# Patient Record
Sex: Male | Born: 1945 | Race: White | Hispanic: No | Marital: Married | Smoking: Never smoker
Health system: Southern US, Community
[De-identification: ages and names within clinical notes are randomized; demographics above are authoritative.]

## PROBLEM LIST (undated history)

## (undated) DIAGNOSIS — I4430 Unspecified atrioventricular block: Secondary | ICD-10-CM

## (undated) DIAGNOSIS — I455 Other specified heart block: Secondary | ICD-10-CM

## (undated) DIAGNOSIS — Z950 Presence of cardiac pacemaker: Secondary | ICD-10-CM

## (undated) DIAGNOSIS — E039 Hypothyroidism, unspecified: Secondary | ICD-10-CM

## (undated) DIAGNOSIS — I10 Essential (primary) hypertension: Secondary | ICD-10-CM

## (undated) DIAGNOSIS — G459 Transient cerebral ischemic attack, unspecified: Secondary | ICD-10-CM

## (undated) DIAGNOSIS — M1990 Unspecified osteoarthritis, unspecified site: Secondary | ICD-10-CM

## (undated) DIAGNOSIS — C801 Malignant (primary) neoplasm, unspecified: Secondary | ICD-10-CM

## (undated) DIAGNOSIS — G4730 Sleep apnea, unspecified: Secondary | ICD-10-CM

## (undated) DIAGNOSIS — I499 Cardiac arrhythmia, unspecified: Secondary | ICD-10-CM

## (undated) HISTORY — PX: KNEE ARTHROSCOPY: SUR90

## (undated) HISTORY — PX: INSERT / REPLACE / REMOVE PACEMAKER: SUR710

## (undated) HISTORY — PX: BACK SURGERY: SHX140

## (undated) HISTORY — PX: HERNIA REPAIR: SHX51

## (undated) HISTORY — PX: DUODENAL DIVERTICULECTOMY: SHX6386

## (undated) HISTORY — PX: SHOULDER SURGERY: SHX246

---

## 2011-03-03 ENCOUNTER — Other Ambulatory Visit (HOSPITAL_COMMUNITY): Payer: Self-pay | Admitting: Neurosurgery

## 2011-03-03 DIAGNOSIS — M545 Low back pain: Secondary | ICD-10-CM

## 2011-03-04 ENCOUNTER — Ambulatory Visit (HOSPITAL_COMMUNITY)
Admission: RE | Admit: 2011-03-04 | Discharge: 2011-03-04 | Disposition: A | Discharge status: Home / Self Care | Payer: BC Managed Care – PPO | Source: Ambulatory Visit | Attending: Neurosurgery | Admitting: Neurosurgery

## 2011-03-04 DIAGNOSIS — M6281 Muscle weakness (generalized): Secondary | ICD-10-CM | POA: Insufficient documentation

## 2011-03-04 DIAGNOSIS — M48061 Spinal stenosis, lumbar region without neurogenic claudication: Secondary | ICD-10-CM | POA: Insufficient documentation

## 2011-03-04 DIAGNOSIS — M519 Unspecified thoracic, thoracolumbar and lumbosacral intervertebral disc disorder: Secondary | ICD-10-CM | POA: Insufficient documentation

## 2011-03-04 DIAGNOSIS — M545 Low back pain, unspecified: Secondary | ICD-10-CM | POA: Insufficient documentation

## 2011-03-04 DIAGNOSIS — Q762 Congenital spondylolisthesis: Secondary | ICD-10-CM | POA: Insufficient documentation

## 2011-03-04 DIAGNOSIS — R209 Unspecified disturbances of skin sensation: Secondary | ICD-10-CM | POA: Insufficient documentation

## 2011-03-04 MED ORDER — IOHEXOL 180 MG/ML  SOLN
20.0000 mL | Freq: Once | INTRAMUSCULAR | Status: AC | PRN
  Administered 2011-03-04: 20 mL via INTRATHECAL

## 2011-04-26 ENCOUNTER — Encounter (HOSPITAL_COMMUNITY)
Admission: RE | Admit: 2011-04-26 | Discharge: 2011-04-26 | Disposition: A | Discharge status: Home / Self Care | Payer: BC Managed Care – PPO | Source: Ambulatory Visit | Attending: Neurosurgery | Admitting: Neurosurgery

## 2011-04-26 ENCOUNTER — Other Ambulatory Visit (HOSPITAL_COMMUNITY): Payer: Self-pay | Admitting: Neurosurgery

## 2011-04-26 ENCOUNTER — Ambulatory Visit (HOSPITAL_COMMUNITY)
Admission: RE | Admit: 2011-04-26 | Discharge: 2011-04-26 | Disposition: A | Discharge status: Home / Self Care | Payer: BC Managed Care – PPO | Source: Ambulatory Visit | Attending: Neurosurgery | Admitting: Neurosurgery

## 2011-04-26 DIAGNOSIS — Z01818 Encounter for other preprocedural examination: Secondary | ICD-10-CM | POA: Insufficient documentation

## 2011-04-26 DIAGNOSIS — Z01812 Encounter for preprocedural laboratory examination: Secondary | ICD-10-CM | POA: Insufficient documentation

## 2011-04-26 DIAGNOSIS — Q762 Congenital spondylolisthesis: Secondary | ICD-10-CM | POA: Insufficient documentation

## 2011-04-26 DIAGNOSIS — Z01810 Encounter for preprocedural cardiovascular examination: Secondary | ICD-10-CM | POA: Insufficient documentation

## 2011-04-26 DIAGNOSIS — M4316 Spondylolisthesis, lumbar region: Secondary | ICD-10-CM

## 2011-04-26 LAB — BASIC METABOLIC PANEL
BUN: 18 mg/dL (ref 6–23)
CO2: 31 mEq/L (ref 19–32)
Calcium: 9.9 mg/dL (ref 8.4–10.5)
Chloride: 103 mEq/L (ref 96–112)
Creatinine, Ser: 1.2 mg/dL (ref 0.4–1.5)
GFR calc Af Amer: >60 mL/min (ref >60)

## 2011-04-26 LAB — SURGICAL PCR SCREEN: MRSA, PCR: NEGATIVE

## 2011-04-26 LAB — CBC
MCH: 31.8 pg (ref 26.0–34.0)
MCHC: 34.7 g/dL (ref 30.0–36.0)
MCV: 91.7 fL (ref 78.0–100.0)
Platelets: 215 10*3/uL (ref 150–400)

## 2011-04-29 ENCOUNTER — Inpatient Hospital Stay (HOSPITAL_COMMUNITY)
Admission: RE | Admit: 2011-04-29 | Discharge: 2011-05-02 | DRG: 756 | Disposition: A | Discharge status: Home / Self Care | Payer: BC Managed Care – PPO | Source: Ambulatory Visit | Attending: Neurosurgery | Admitting: Neurosurgery

## 2011-04-29 ENCOUNTER — Inpatient Hospital Stay (HOSPITAL_COMMUNITY): Payer: BC Managed Care – PPO

## 2011-04-29 ENCOUNTER — Other Ambulatory Visit: Payer: Self-pay | Admitting: Neurosurgery

## 2011-04-29 DIAGNOSIS — M51379 Other intervertebral disc degeneration, lumbosacral region without mention of lumbar back pain or lower extremity pain: Secondary | ICD-10-CM | POA: Diagnosis present

## 2011-04-29 DIAGNOSIS — Z01812 Encounter for preprocedural laboratory examination: Secondary | ICD-10-CM

## 2011-04-29 DIAGNOSIS — I10 Essential (primary) hypertension: Secondary | ICD-10-CM | POA: Diagnosis present

## 2011-04-29 DIAGNOSIS — M5137 Other intervertebral disc degeneration, lumbosacral region: Secondary | ICD-10-CM | POA: Diagnosis present

## 2011-04-29 DIAGNOSIS — K5900 Constipation, unspecified: Secondary | ICD-10-CM | POA: Diagnosis not present

## 2011-04-29 DIAGNOSIS — M47817 Spondylosis without myelopathy or radiculopathy, lumbosacral region: Principal | ICD-10-CM | POA: Diagnosis present

## 2011-04-29 DIAGNOSIS — Z472 Encounter for removal of internal fixation device: Secondary | ICD-10-CM

## 2011-04-29 DIAGNOSIS — E669 Obesity, unspecified: Secondary | ICD-10-CM | POA: Diagnosis present

## 2011-04-29 DIAGNOSIS — Q762 Congenital spondylolisthesis: Secondary | ICD-10-CM

## 2011-04-29 DIAGNOSIS — E039 Hypothyroidism, unspecified: Secondary | ICD-10-CM | POA: Diagnosis present

## 2011-04-29 LAB — TYPE AND SCREEN

## 2011-04-29 LAB — ABO/RH: ABO/RH(D): O POS

## 2011-04-29 LAB — GRAM STAIN

## 2011-05-01 LAB — URINALYSIS, ROUTINE W REFLEX MICROSCOPIC
Glucose, UA: NEGATIVE mg/dL
Ketones, ur: NEGATIVE mg/dL
Leukocytes, UA: NEGATIVE
Nitrite: NEGATIVE
Protein, ur: NEGATIVE mg/dL
Urobilinogen, UA: 0.2 mg/dL (ref 0.0–1.0)

## 2011-05-01 LAB — WOUND CULTURE

## 2011-05-01 LAB — URINE MICROSCOPIC-ADD ON

## 2011-05-02 LAB — URINE CULTURE
Colony Count: NO GROWTH
Culture: NO GROWTH

## 2011-05-02 LAB — TISSUE CULTURE: Culture: NO GROWTH

## 2011-05-04 LAB — ANAEROBIC CULTURE

## 2011-05-04 NOTE — Op Note (Signed)
NAME:  MONT, JAGODA                 ACCOUNT NO.:  [id number]  MEDICAL RECORD NO.:  [id number]           PATIENT TYPE:  I  LOCATION:  3007                         FACILITY:  [facility]  PHYSICIAN:  Danae Orleans. Venetia Maxon, M.D.  DATE OF BIRTH:  Jul 31, 1946  DATE OF PROCEDURE:  04/29/2011 DATE OF DISCHARGE:                              OPERATIVE REPORT   PREOPERATIVE DIAGNOSES:  L3-4 and L4-5 spondylosis, stenosis, spondylolisthesis, degenerative disk disease, and radiculopathy.  POSTOPERATIVE DIAGNOSES:  L3-4 and L4-5 spondylosis, stenosis, spondylolisthesis, degenerative disk disease, and radiculopathy.  PROCEDURES: 1. Removal of X-Stop devices placed by another surgeon at L3-4 and L4-     5 levels. 2. Decompressive laminectomy at L3 through L5 levels. 3. Pedicle screw fixation at L3 through L5 bilaterally. 4. Posterolateral arthrodesis L3 through L5 levels.  SURGEON:  Danae Orleans. Venetia Maxon, MD  ASSISTANT:  Georgiann Cocker, RN and Clydene Fake, MD  ANESTHESIA:  General endotracheal anesthesia.  ESTIMATED BLOOD LOSS:  700 mL with Cell Saver blood returned to the patient.  COMPLICATIONS:  None.  DISPOSITION:  Recovery.  INDICATIONS:  Grigor Lipschutz is a 65 year old man with severe lumbar spinal stenosis with a complete myelographic block at L4-5 and an incomplete block at L3-4 with spondylolisthesis at L4-5 greater than L3-4.  He has had prior X-Stop devices placed.  He had some erosion around the L4-5 device that had been placed 5 years ago by another physician.  It was elected because of severity of the patient's pain and inability to stand for greater than couple of minutes for him to undergo decompression and fusion at L3 through L5 levels.  PROCEDURE:  Mr. Sky was brought to the operating room.  Following satisfactory and uncomplicated induction of general endotracheal anesthesia and placement of intravenous lines and Foley catheter, the patient was turned prone position on the  operating table.  His low back was prepped and draped in the usual sterile fashion.  The area of planned incision was infiltrated with local lidocaine.  Previous incision was reopened, carried cephalad one level.  The previously placed X-Stop devices were exposed.  There was what appeared to be some purulent material around the L4-5 X-Stop device and numerous cultures were sent.  There did not appear to be grossly purulent, but potentially some inflammatory reaction to the device.  There was no inflammatory response around the L3-4 device.  Both devices were removed and the soft tissue surrounding the place, the L4-5 device was removed and sent to pathology along with cultures.  These cultures were consistent with white cell of inflammatory response, but no evidence of any organisms. Subsequently, the posterolateral regions were exposed bilaterally at L3, transverse processes of L4 and L5 transverse processes.  A total laminectomy of L4 was performed and the superior half of L5 was removed along with the inferior half of L3 and extensive decompression was performed using a high-speed drill and then variety of Kerrison rongeurs.  Upon doing painstaking decompression under loupe magnification, it was felt that the neural elements were sufficiently decompressed, it was not necessary to perform interbody grafting and the disk material did  not appear to be sufficiently contributing to nerve root compression at to warrant to doing so.  It was also concerned because of the potential for the infection related to the previously removed implant, therefore I elected to place pedicle screws at L3 through L5 bilaterally and this was done under fluoroscopic visualization using AP and lateral fluoroscopy.  A 6.5 x 40-mm screws were placed at L5 and then 6.5 x 45-mm screws were placed at L4 and L3 bilaterally.  All screws had excellent purchase and their positioning was confirmed on AP and lateral  fluoroscopy.  A 70-mm preloaded rods were affixed to the screw heads after the posterolateral region was extensively decorticated and profuse blocks with PureGen was then placed in the posterolateral region along with approximately 8 mL of local bone autograft, which had been run through the bone mill on either side for total 16 mL of bone graft material.  The wound had been irrigated.  The screws were locked down in situ.  Self-retaining retractor was removed. Neural elements were felt to be well decompressed with excellent hemostasis.  The fascia was reapproximated with 1 Vicryl sutures, subcutaneous tissues were reapproximated with 2-0 Vicryl and interrupted inverted sutures and skin edges were reapproximated with 3-0 Vicryl subcuticular stitch.  The would was dressed with Benzoin, Steri-Strips, Telfa gauze and tape.  The patient was extubated in the operating room and taken to the recovery in stable and satisfactory condition having tolerated his operation well.  Counts were correct at the end of the case.     Danae Orleans. Venetia Maxon, M.D.     JDS/MEDQ  D:  04/29/2011  T:  04/30/2011  Job:  [id number]  Electronically Signed by Maeola Harman M.D. on 05/04/2011 07:44:30 AM

## 2011-05-07 NOTE — Discharge Summary (Signed)
  NAME:  Dustin Keith, Dustin Keith                 ACCOUNT NO.:  [id number]  MEDICAL RECORD NO.:  [id number]           PATIENT TYPE:  I  LOCATION:  3007                         FACILITY:  [facility]  PHYSICIAN:  Coletta Memos, M.D.     DATE OF BIRTH:  [birth year]  DATE OF ADMISSION:  04/29/2011 DATE OF DISCHARGE:  05/02/2011                              DISCHARGE SUMMARY   ADMITTING DIAGNOSIS:  L3-4, L4-5 spondylosis, stenosis spondylolisthesis, degenerative disk disease, radiculopathy.  DISCHARGE DIAGNOSIS:  L3-4, L4-5 spondylosis, stenosis spondylolisthesis, degenerative disk disease, radiculopathy.  PROCEDURE:  Decompressive laminectomy L2-L5, posterolateral arthrodesis, pedicle screw fixation L3-L5 using [employer] instrumentation.  COMPLICATIONS:  None.  DISCHARGE STATUS:  Alive and well.  DISCHARGE DESTINATION:  Home.  MEDICATIONS:  Percocet and Flexeril.  WOUND:  Clean, some sanguineous drainage from the inferior portion of the wound.  No evidence of infection.  He is voiding, tolerating a regular diet has normal strength.  Dr. Venetia Maxon admitted Mr. Rudin for low back pain secondary to L3-4 and L4- 5 spondylosis, stenosis and listhesis.  He has done well postop exception of some constipation.  __________ .  He wishes to leave today. His ambulating, voiding, tolerating a regular diet, no problems.  He will call the office and make arranged for followup appointment.  He was given discharge instructions per Dr. Venetia Maxon.          ______________________________ Coletta Memos, M.D.     KC/MEDQ  D:  05/02/2011  T:  05/02/2011  Job:  [id number]  Electronically Signed by Coletta Memos M.D. on 05/07/2011 11:50:27 AM

## 2011-06-14 ENCOUNTER — Emergency Department (HOSPITAL_BASED_OUTPATIENT_CLINIC_OR_DEPARTMENT_OTHER)
Admission: EM | Admit: 2011-06-14 | Discharge: 2011-06-14 | Disposition: A | Discharge status: Home / Self Care | Payer: BC Managed Care – PPO | Attending: Emergency Medicine | Admitting: Emergency Medicine

## 2011-06-14 DIAGNOSIS — M542 Cervicalgia: Secondary | ICD-10-CM | POA: Insufficient documentation

## 2011-06-14 DIAGNOSIS — M5412 Radiculopathy, cervical region: Secondary | ICD-10-CM | POA: Insufficient documentation

## 2011-06-14 DIAGNOSIS — G8929 Other chronic pain: Secondary | ICD-10-CM | POA: Insufficient documentation

## 2011-06-14 DIAGNOSIS — I10 Essential (primary) hypertension: Secondary | ICD-10-CM | POA: Insufficient documentation

## 2011-06-16 ENCOUNTER — Ambulatory Visit (HOSPITAL_BASED_OUTPATIENT_CLINIC_OR_DEPARTMENT_OTHER): Admit: 2011-06-16 | Payer: BC Managed Care – PPO

## 2011-06-19 ENCOUNTER — Ambulatory Visit (HOSPITAL_BASED_OUTPATIENT_CLINIC_OR_DEPARTMENT_OTHER)
Admission: RE | Admit: 2011-06-19 | Discharge: 2011-06-19 | Disposition: A | Discharge status: Home / Self Care | Payer: BC Managed Care – PPO | Source: Ambulatory Visit | Attending: Emergency Medicine | Admitting: Emergency Medicine

## 2011-06-19 DIAGNOSIS — M25529 Pain in unspecified elbow: Secondary | ICD-10-CM | POA: Insufficient documentation

## 2011-06-19 DIAGNOSIS — M542 Cervicalgia: Secondary | ICD-10-CM | POA: Insufficient documentation

## 2011-06-19 DIAGNOSIS — M4802 Spinal stenosis, cervical region: Secondary | ICD-10-CM | POA: Insufficient documentation

## 2011-06-19 DIAGNOSIS — M5090 Cervical disc disorder, unspecified, unspecified cervical region: Secondary | ICD-10-CM | POA: Insufficient documentation

## 2011-06-19 DIAGNOSIS — M25519 Pain in unspecified shoulder: Secondary | ICD-10-CM | POA: Insufficient documentation

## 2012-06-21 IMAGING — CR DG LUMBAR SPINE 1V
1 series · 1 of 1 positions shown · non-contrast
Comparison: CT myelogram 03/04/2011

CLINICAL DATA: Back pain

LUMBAR SPINE - 1 VIEW

[view not recorded]
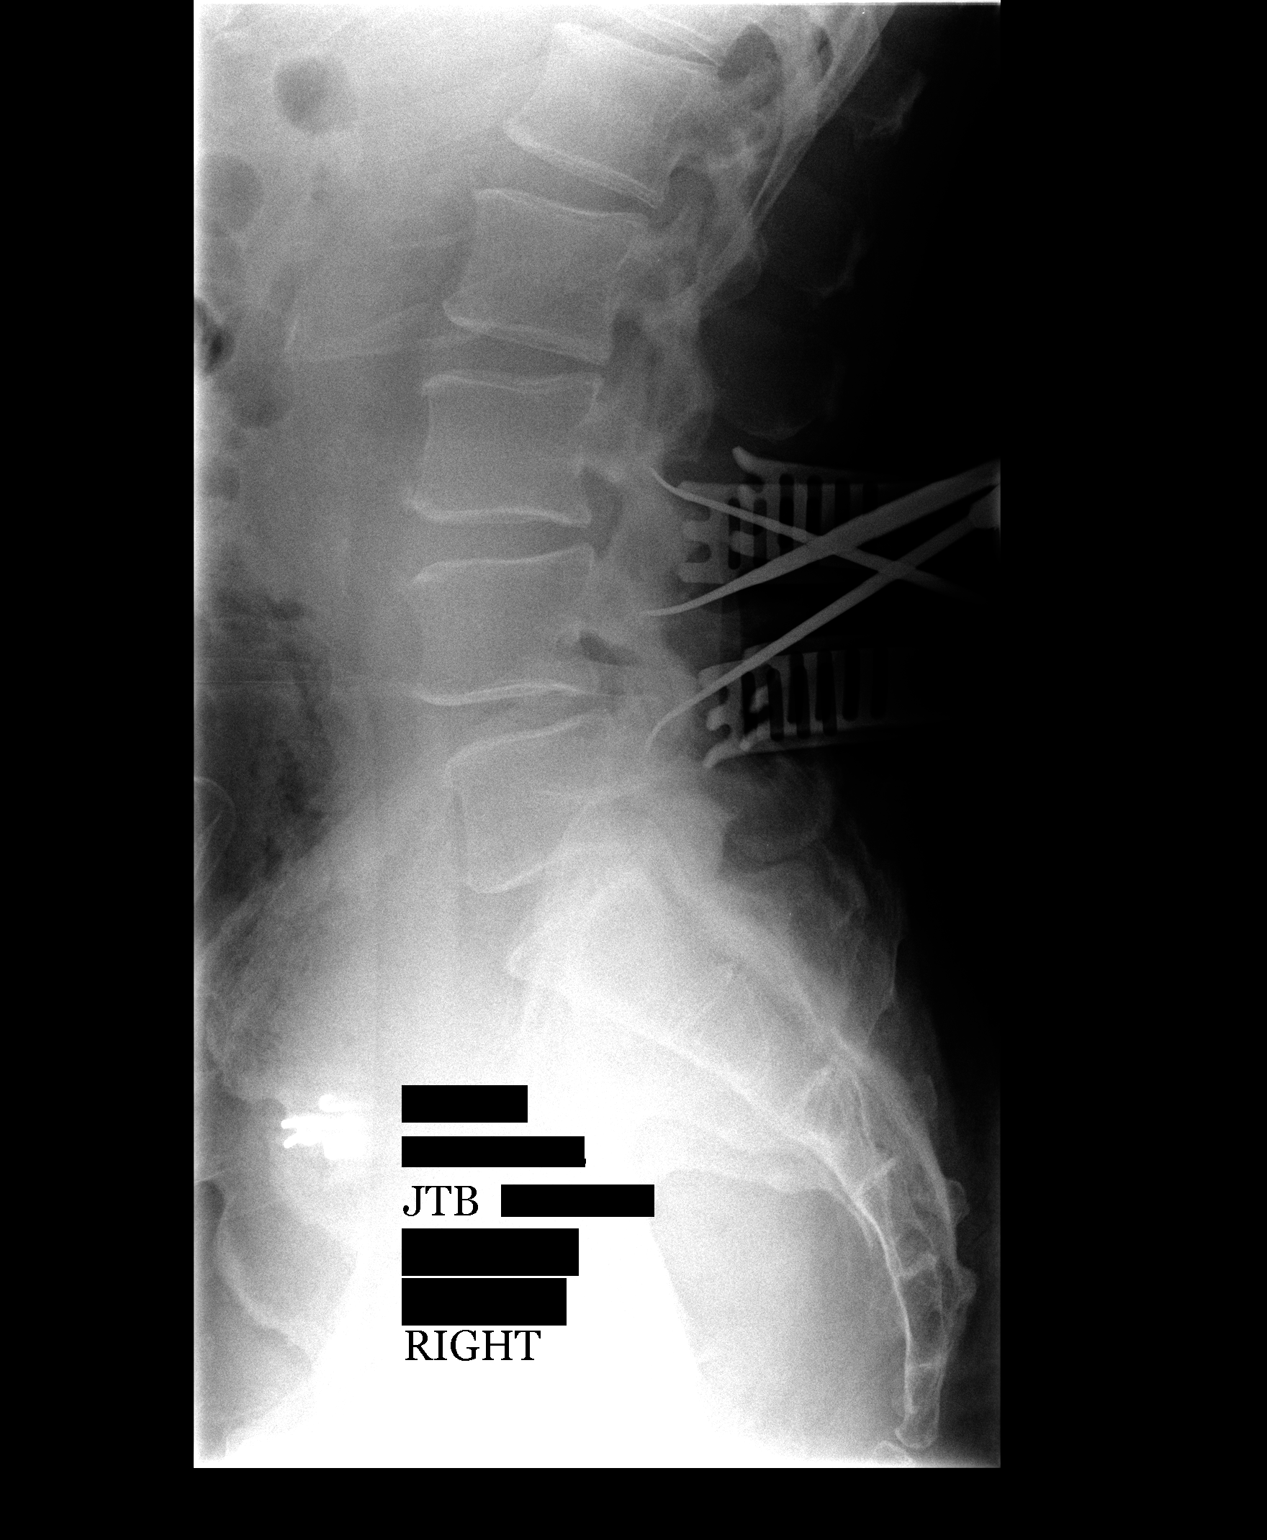

[1 of 1 positions shown; findings below may reference images not displayed]

FINDINGS: Blunt probes overlie the L3, L4, and L5 pedicles.
Previous Xstop L3-4 and L4-5   Devices have been removed.
IMPRESSION: As above.

## 2012-06-21 IMAGING — RF DG LUMBAR SPINE 2-3V
1 series · 2 of 2 positions shown · non-contrast
Comparison: CT myelogram 03/04/2011

CLINICAL DATA: Back pain

LUMBAR SPINE - 2-3 VIEW

[Series 1: run · 2 of 2 slices shown]
[im 1/2]
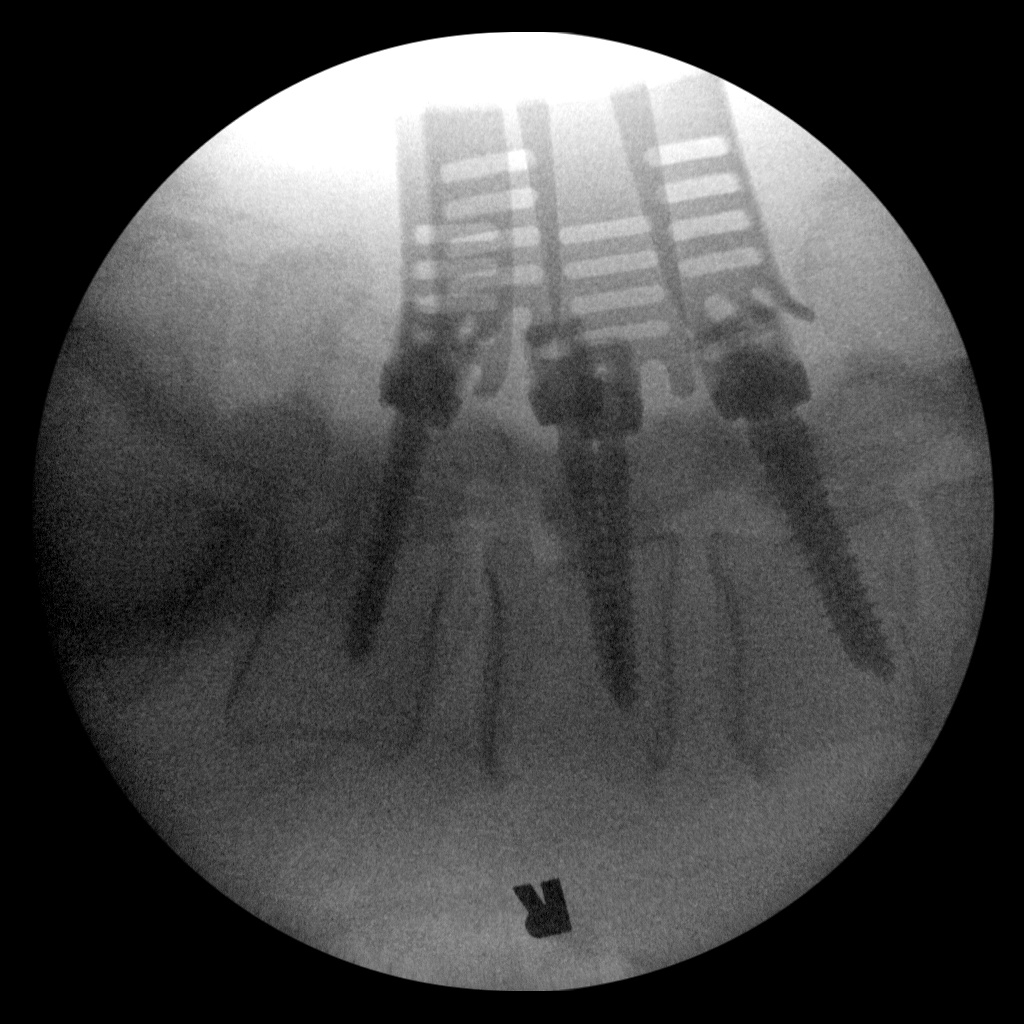
[im 2/2]
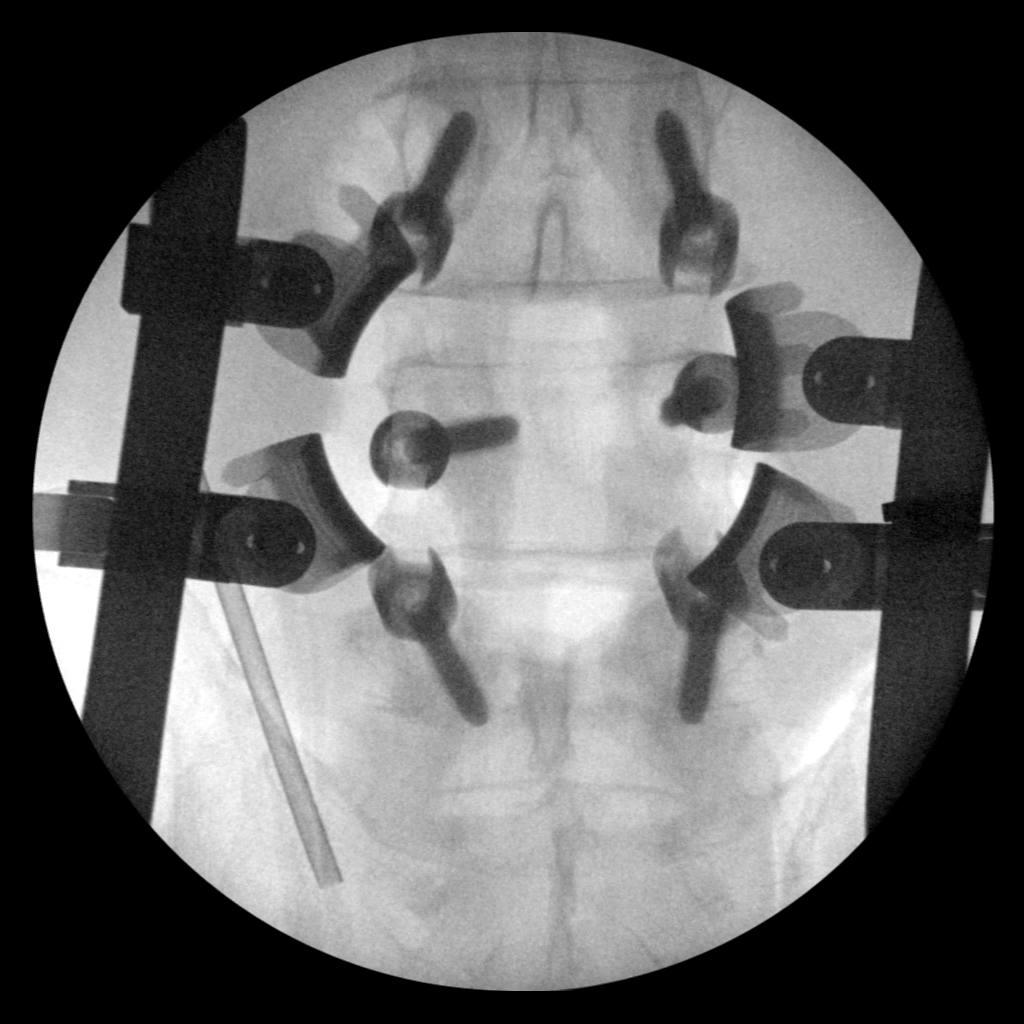

[2 of 2 positions shown; findings below may reference images not displayed]

FINDINGS: Intraoperative films document placement of pedicle screws
at L3, L4, and L5.  Satisfactory position and alignment.
IMPRESSION: As above.

## 2015-07-22 ENCOUNTER — Other Ambulatory Visit: Payer: Self-pay | Admitting: Neurosurgery

## 2015-07-22 DIAGNOSIS — M5416 Radiculopathy, lumbar region: Secondary | ICD-10-CM

## 2015-07-28 ENCOUNTER — Ambulatory Visit
Admission: RE | Admit: 2015-07-28 | Discharge: 2015-07-28 | Disposition: A | Discharge status: Home / Self Care | Payer: Medicare Other | Source: Ambulatory Visit | Attending: Neurosurgery | Admitting: Neurosurgery

## 2015-07-28 DIAGNOSIS — M5416 Radiculopathy, lumbar region: Secondary | ICD-10-CM

## 2015-07-28 MED ORDER — GADOBENATE DIMEGLUMINE 529 MG/ML IV SOLN
20.0000 mL | Freq: Once | INTRAVENOUS | Status: AC | PRN
  Administered 2015-07-28: 20 mL via INTRAVENOUS

## 2015-07-31 ENCOUNTER — Other Ambulatory Visit: Payer: Self-pay | Admitting: Neurosurgery

## 2015-08-12 ENCOUNTER — Inpatient Hospital Stay (HOSPITAL_COMMUNITY): Admission: RE | Admit: 2015-08-12 | Payer: BLUE CROSS/BLUE SHIELD | Source: Ambulatory Visit

## 2015-08-19 ENCOUNTER — Inpatient Hospital Stay (HOSPITAL_COMMUNITY): Admission: RE | Admit: 2015-08-19 | Payer: BLUE CROSS/BLUE SHIELD | Source: Ambulatory Visit | Admitting: Neurosurgery

## 2015-08-19 ENCOUNTER — Encounter (HOSPITAL_COMMUNITY): Admission: RE | Payer: Self-pay | Source: Ambulatory Visit

## 2015-08-19 SURGERY — POSTERIOR LUMBAR FUSION 2 LEVEL
Anesthesia: General | Site: Back

## 2015-08-22 ENCOUNTER — Other Ambulatory Visit: Payer: Self-pay | Admitting: Neurosurgery

## 2015-10-15 ENCOUNTER — Encounter (HOSPITAL_COMMUNITY): Payer: Self-pay

## 2015-10-15 ENCOUNTER — Encounter (HOSPITAL_COMMUNITY)
Admission: RE | Admit: 2015-10-15 | Discharge: 2015-10-15 | Disposition: A | Discharge status: Home / Self Care | Payer: BLUE CROSS/BLUE SHIELD | Source: Ambulatory Visit | Attending: Neurosurgery | Admitting: Neurosurgery

## 2015-10-15 DIAGNOSIS — Z01818 Encounter for other preprocedural examination: Secondary | ICD-10-CM | POA: Diagnosis present

## 2015-10-15 DIAGNOSIS — I10 Essential (primary) hypertension: Secondary | ICD-10-CM | POA: Insufficient documentation

## 2015-10-15 DIAGNOSIS — Z01812 Encounter for preprocedural laboratory examination: Secondary | ICD-10-CM | POA: Insufficient documentation

## 2015-10-15 DIAGNOSIS — I440 Atrioventricular block, first degree: Secondary | ICD-10-CM | POA: Insufficient documentation

## 2015-10-15 DIAGNOSIS — Z0183 Encounter for blood typing: Secondary | ICD-10-CM | POA: Diagnosis not present

## 2015-10-15 DIAGNOSIS — M5416 Radiculopathy, lumbar region: Secondary | ICD-10-CM | POA: Diagnosis not present

## 2015-10-15 HISTORY — DX: Hypothyroidism, unspecified: E03.9

## 2015-10-15 HISTORY — DX: Unspecified osteoarthritis, unspecified site: M19.90

## 2015-10-15 HISTORY — DX: Essential (primary) hypertension: I10

## 2015-10-15 HISTORY — DX: Malignant (primary) neoplasm, unspecified: C80.1

## 2015-10-15 HISTORY — DX: Sleep apnea, unspecified: G47.30

## 2015-10-15 LAB — BASIC METABOLIC PANEL
Anion gap: 12 (ref 5–15)
BUN: 19 mg/dL (ref 6–20)
CHLORIDE: 104 mmol/L (ref 101–111)
CO2: 22 mmol/L (ref 22–32)
CREATININE: 1.11 mg/dL (ref 0.61–1.24)
Calcium: 9.6 mg/dL (ref 8.9–10.3)
GFR calc Af Amer: >60 mL/min (ref >60)
GFR calc non Af Amer: >60 mL/min (ref >60)
GLUCOSE: 84 mg/dL (ref 65–99)
Potassium: 4.1 mmol/L (ref 3.5–5.1)
SODIUM: 138 mmol/L (ref 135–145)

## 2015-10-15 LAB — CBC
HCT: 48.3 % (ref 39.0–52.0)
HEMOGLOBIN: 16.5 g/dL (ref 13.0–17.0)
MCH: 31.6 pg (ref 26.0–34.0)
MCHC: 34.2 g/dL (ref 30.0–36.0)
MCV: 92.5 fL (ref 78.0–100.0)
Platelets: 191 10*3/uL (ref 150–400)
RBC: 5.22 MIL/uL (ref 4.22–5.81)
RDW: 14.1 % (ref 11.5–15.5)
WBC: 7.3 10*3/uL (ref 4.0–10.5)

## 2015-10-15 LAB — TYPE AND SCREEN
ABO/RH(D): O POS
Antibody Screen: NEGATIVE

## 2015-10-15 LAB — SURGICAL PCR SCREEN
MRSA, PCR: NEGATIVE
STAPHYLOCOCCUS AUREUS: NEGATIVE

## 2015-10-15 NOTE — Progress Notes (Signed)
REQUESTED SLEEP STUDY  [place]/ [facility]  [facility] [phone]).

## 2015-10-15 NOTE — Pre-Procedure Instructions (Addendum)
Angle Karel  10/15/2015      [facility] - [place], [place] - [address] [place] [address] Phone: [phone] Fax: [phone]    Your procedure is scheduled on  Thursday 10/23/15  Report to [facility] Admitting at 530 A.M.  Call this number if you have problems the morning of surgery:  [phone]   Remember:  Do not eat food or drink liquids after midnight.  Take these medicines the morning of surgery with A SIP OF WATER  AMLODIPINE  (NORVASC), GABAPENTIN, LEVOTHYROXINE  (STOP MULTIVITAMIN, IBUPROFEN, ANY MEDICINE CONTAINING ASPIRIN)   Do not wear jewelry, make-up or nail polish.  Do not wear lotions, powders, or perfumes.  You may wear deodorant.  Do not shave 48 hours prior to surgery.  Men may shave face and neck.  Do not bring valuables to the hospital.  [facility] is not responsible for any belongings or valuables.  Contacts, dentures or bridgework may not be worn into surgery.  Leave your suitcase in the car.  After surgery it may be brought to your room.  For patients admitted to the hospital, discharge time will be determined by your treatment team.  Patients discharged the day of surgery will not be allowed to drive home.   Name and phone number of your driver:   Special instructions:  [place] - Preparing for Surgery  Before surgery, you can play an important role.  Because skin is not sterile, your skin needs to be as free of germs as possible.  You can reduce the number of germs on you skin by washing with CHG (chlorahexidine gluconate) soap before surgery.  CHG is an antiseptic cleaner which kills germs and bonds with the skin to continue killing germs even after washing.  Please DO NOT use if you have an allergy to CHG or antibacterial soaps.  If your skin becomes reddened/irritated stop using the CHG and inform your nurse when you arrive at Short Stay.  Do not shave  (including legs and underarms) for at least 48 hours prior to the first CHG shower.  You may shave your face.  Please follow these instructions carefully:   1.  Shower with CHG Soap the night before surgery and the                                morning of Surgery.  2.  If you choose to wash your hair, wash your hair first as usual with your       normal shampoo.  3.  After you shampoo, rinse your hair and body thoroughly to remove the                      Shampoo.  4.  Use CHG as you would any other liquid soap.  You can apply chg directly       to the skin and wash gently with scrungie or a clean washcloth.  5.  Apply the CHG Soap to your body ONLY FROM THE NECK DOWN.        Do not use on open wounds or open sores.  Avoid contact with your eyes,       ears, mouth and genitals (private parts).  Wash genitals (private parts)       with your normal soap.  6.  Wash thoroughly, paying special attention to the area where your surgery        will be performed.  7.  Thoroughly rinse your body with warm water from the neck down.  8.  DO NOT shower/wash with your normal soap after using and rinsing off       the CHG Soap.  9.  Pat yourself dry with a clean towel.            10.  Wear clean pajamas.            11.  Place clean sheets on your bed the night of your first shower and do not        sleep with pets.  Day of Surgery  Do not apply any lotions/deoderants the morning of surgery.  Please wear clean clothes to the hospital/surgery center.    Please read over the following fact sheets that you were given. Pain Booklet, Coughing and Deep Breathing, Blood Transfusion Information, MRSA Information and Surgical Site Infection Prevention

## 2015-10-22 MED ORDER — CEFAZOLIN SODIUM-DEXTROSE 2-3 GM-% IV SOLR
2.0000 g | INTRAVENOUS | Status: AC
  Administered 2015-10-23: 3 g via INTRAVENOUS
  Administered 2015-10-23: 1 g via INTRAVENOUS
  Administered 2015-10-23: 2 g via INTRAVENOUS
  Filled 2015-10-22: qty 50

## 2015-10-23 ENCOUNTER — Inpatient Hospital Stay (HOSPITAL_COMMUNITY)
Admission: RE | Admit: 2015-10-23 | Discharge: 2015-10-26 | DRG: 460 | Disposition: A | Discharge status: Home / Self Care | Payer: BLUE CROSS/BLUE SHIELD | Source: Ambulatory Visit | Attending: Neurosurgery | Admitting: Neurosurgery

## 2015-10-23 ENCOUNTER — Encounter (HOSPITAL_COMMUNITY)
Admission: RE | Disposition: A | Discharge status: Home / Self Care | Payer: BLUE CROSS/BLUE SHIELD | Source: Ambulatory Visit | Attending: Neurosurgery

## 2015-10-23 ENCOUNTER — Inpatient Hospital Stay (HOSPITAL_COMMUNITY): Payer: BLUE CROSS/BLUE SHIELD | Admitting: Certified Registered Nurse Anesthetist

## 2015-10-23 ENCOUNTER — Inpatient Hospital Stay (HOSPITAL_COMMUNITY): Payer: BLUE CROSS/BLUE SHIELD

## 2015-10-23 ENCOUNTER — Encounter (HOSPITAL_COMMUNITY): Payer: Self-pay | Admitting: *Deleted

## 2015-10-23 DIAGNOSIS — M25562 Pain in left knee: Secondary | ICD-10-CM | POA: Diagnosis present

## 2015-10-23 DIAGNOSIS — Z79899 Other long term (current) drug therapy: Secondary | ICD-10-CM

## 2015-10-23 DIAGNOSIS — M4807 Spinal stenosis, lumbosacral region: Secondary | ICD-10-CM | POA: Diagnosis present

## 2015-10-23 DIAGNOSIS — M5116 Intervertebral disc disorders with radiculopathy, lumbar region: Secondary | ICD-10-CM | POA: Diagnosis present

## 2015-10-23 DIAGNOSIS — I10 Essential (primary) hypertension: Secondary | ICD-10-CM | POA: Diagnosis present

## 2015-10-23 DIAGNOSIS — M5117 Intervertebral disc disorders with radiculopathy, lumbosacral region: Secondary | ICD-10-CM | POA: Diagnosis present

## 2015-10-23 DIAGNOSIS — M960 Pseudarthrosis after fusion or arthrodesis: Secondary | ICD-10-CM | POA: Diagnosis present

## 2015-10-23 DIAGNOSIS — M4806 Spinal stenosis, lumbar region: Secondary | ICD-10-CM | POA: Diagnosis present

## 2015-10-23 DIAGNOSIS — R339 Retention of urine, unspecified: Secondary | ICD-10-CM | POA: Diagnosis not present

## 2015-10-23 DIAGNOSIS — M25561 Pain in right knee: Secondary | ICD-10-CM | POA: Diagnosis present

## 2015-10-23 DIAGNOSIS — M549 Dorsalgia, unspecified: Secondary | ICD-10-CM | POA: Diagnosis present

## 2015-10-23 DIAGNOSIS — M5126 Other intervertebral disc displacement, lumbar region: Secondary | ICD-10-CM | POA: Diagnosis present

## 2015-10-23 DIAGNOSIS — Z419 Encounter for procedure for purposes other than remedying health state, unspecified: Secondary | ICD-10-CM

## 2015-10-23 SURGERY — POSTERIOR LUMBAR FUSION 2 LEVEL
Anesthesia: General | Site: Back

## 2015-10-23 MED ORDER — HYDROMORPHONE HCL 1 MG/ML IJ SOLN
0.5000 mg | INTRAMUSCULAR | Status: DC | PRN
  Administered 2015-10-23 – 2015-10-24 (×3): 1 mg via INTRAVENOUS
  Filled 2015-10-23 (×3): qty 1

## 2015-10-23 MED ORDER — FENTANYL CITRATE (PF) 250 MCG/5ML IJ SOLN
INTRAMUSCULAR | Status: AC
  Filled 2015-10-23: qty 5

## 2015-10-23 MED ORDER — PROPOFOL 10 MG/ML IV BOLUS
INTRAVENOUS | Status: AC
  Filled 2015-10-23: qty 20

## 2015-10-23 MED ORDER — PHENYLEPHRINE 40 MCG/ML (10ML) SYRINGE FOR IV PUSH (FOR BLOOD PRESSURE SUPPORT)
PREFILLED_SYRINGE | INTRAVENOUS | Status: AC
  Filled 2015-10-23: qty 10

## 2015-10-23 MED ORDER — CEFAZOLIN SODIUM 10 G IJ SOLR
3.0000 g | Freq: Three times a day (TID) | INTRAMUSCULAR | Status: AC
  Administered 2015-10-24 (×2): 3 g via INTRAVENOUS
  Filled 2015-10-23 (×2): qty 3000

## 2015-10-23 MED ORDER — ROCURONIUM BROMIDE 50 MG/5ML IV SOLN
INTRAVENOUS | Status: AC
  Filled 2015-10-23: qty 1

## 2015-10-23 MED ORDER — ONDANSETRON HCL 4 MG/2ML IJ SOLN
INTRAMUSCULAR | Status: AC
  Filled 2015-10-23: qty 4

## 2015-10-23 MED ORDER — SODIUM CHLORIDE 0.9 % IV SOLN
INTRAVENOUS | Status: DC | PRN
  Administered 2015-10-23: 10:00:00 via INTRAVENOUS

## 2015-10-23 MED ORDER — CEFAZOLIN SODIUM-DEXTROSE 2-3 GM-% IV SOLR
INTRAVENOUS | Status: AC
  Filled 2015-10-23: qty 100

## 2015-10-23 MED ORDER — PHENYLEPHRINE HCL 10 MG/ML IJ SOLN
INTRAMUSCULAR | Status: DC | PRN
  Administered 2015-10-23: 80 ug via INTRAVENOUS
  Administered 2015-10-23: 40 ug via INTRAVENOUS

## 2015-10-23 MED ORDER — PHENYLEPHRINE HCL 10 MG/ML IJ SOLN
INTRAMUSCULAR | Status: AC
  Filled 2015-10-23: qty 1

## 2015-10-23 MED ORDER — LEVOTHYROXINE SODIUM 100 MCG PO TABS
100.0000 ug | ORAL_TABLET | Freq: Every day | ORAL | Status: DC
  Administered 2015-10-24 – 2015-10-26 (×3): 100 ug via ORAL
  Filled 2015-10-23 (×3): qty 1

## 2015-10-23 MED ORDER — SODIUM CHLORIDE 0.9 % IV SOLN: 250.0000 mL | INTRAVENOUS | Status: DC

## 2015-10-23 MED ORDER — LIDOCAINE HCL (CARDIAC) 20 MG/ML IV SOLN
INTRAVENOUS | Status: AC
  Filled 2015-10-23: qty 5

## 2015-10-23 MED ORDER — ROCURONIUM BROMIDE 100 MG/10ML IV SOLN
INTRAVENOUS | Status: DC | PRN
  Administered 2015-10-23: 50 mg via INTRAVENOUS
  Administered 2015-10-23 (×3): 10 mg via INTRAVENOUS
  Administered 2015-10-23: 20 mg via INTRAVENOUS

## 2015-10-23 MED ORDER — KCL IN DEXTROSE-NACL 20-5-0.45 MEQ/L-%-% IV SOLN
INTRAVENOUS | Status: DC
  Administered 2015-10-23: 1000 mL via INTRAVENOUS
  Filled 2015-10-23 (×2): qty 1000

## 2015-10-23 MED ORDER — AMLODIPINE BESYLATE 10 MG PO TABS
10.0000 mg | ORAL_TABLET | Freq: Every day | ORAL | Status: DC
  Administered 2015-10-23 – 2015-10-26 (×4): 10 mg via ORAL
  Filled 2015-10-23 (×4): qty 1

## 2015-10-23 MED ORDER — HYDROCODONE-ACETAMINOPHEN 5-325 MG PO TABS
1.0000 | ORAL_TABLET | ORAL | Status: DC | PRN
  Administered 2015-10-24 – 2015-10-25 (×2): 2 via ORAL
  Filled 2015-10-23 (×2): qty 2

## 2015-10-23 MED ORDER — METHOCARBAMOL 1000 MG/10ML IJ SOLN
500.0000 mg | Freq: Four times a day (QID) | INTRAVENOUS | Status: DC | PRN
  Administered 2015-10-23: 500 mg via INTRAVENOUS
  Filled 2015-10-23 (×3): qty 5

## 2015-10-23 MED ORDER — EPHEDRINE SULFATE 50 MG/ML IJ SOLN
INTRAMUSCULAR | Status: AC
  Filled 2015-10-23: qty 1

## 2015-10-23 MED ORDER — ONDANSETRON HCL 4 MG/2ML IJ SOLN
INTRAMUSCULAR | Status: AC
  Filled 2015-10-23: qty 2

## 2015-10-23 MED ORDER — BUPIVACAINE LIPOSOME 1.3 % IJ SUSP
INTRAMUSCULAR | Status: DC | PRN
  Administered 2015-10-23: 20 mL

## 2015-10-23 MED ORDER — DOCUSATE SODIUM 100 MG PO CAPS
100.0000 mg | ORAL_CAPSULE | Freq: Two times a day (BID) | ORAL | Status: DC
  Administered 2015-10-23 – 2015-10-26 (×6): 100 mg via ORAL
  Filled 2015-10-23 (×6): qty 1

## 2015-10-23 MED ORDER — MEPERIDINE HCL 25 MG/ML IJ SOLN: 6.2500 mg | INTRAMUSCULAR | Status: DC | PRN

## 2015-10-23 MED ORDER — LIDOCAINE HCL (CARDIAC) 20 MG/ML IV SOLN
INTRAVENOUS | Status: DC | PRN
  Administered 2015-10-23: 40 mg via INTRAVENOUS

## 2015-10-23 MED ORDER — ADULT MULTIVITAMIN W/MINERALS CH
1.0000 | ORAL_TABLET | Freq: Every day | ORAL | Status: DC
  Administered 2015-10-24 – 2015-10-26 (×3): 1 via ORAL
  Filled 2015-10-23 (×3): qty 1

## 2015-10-23 MED ORDER — LACTATED RINGERS IV SOLN
INTRAVENOUS | Status: DC | PRN
  Administered 2015-10-23 (×3): via INTRAVENOUS

## 2015-10-23 MED ORDER — MENTHOL 3 MG MT LOZG: 1.0000 | LOZENGE | OROMUCOSAL | Status: DC | PRN

## 2015-10-23 MED ORDER — ACETAMINOPHEN 650 MG RE SUPP: 650.0000 mg | RECTAL | Status: DC | PRN

## 2015-10-23 MED ORDER — SODIUM CHLORIDE 0.9 % IJ SOLN
3.0000 mL | Freq: Two times a day (BID) | INTRAMUSCULAR | Status: DC
  Administered 2015-10-23 – 2015-10-25 (×6): 3 mL via INTRAVENOUS

## 2015-10-23 MED ORDER — BUPIVACAINE LIPOSOME 1.3 % IJ SUSP
20.0000 mL | INTRAMUSCULAR | Status: DC
  Filled 2015-10-23: qty 20

## 2015-10-23 MED ORDER — PHENOL 1.4 % MT LIQD: 1.0000 | OROMUCOSAL | Status: DC | PRN

## 2015-10-23 MED ORDER — LIDOCAINE-EPINEPHRINE 1 %-1:100000 IJ SOLN
INTRAMUSCULAR | Status: DC | PRN
  Administered 2015-10-23: 17 mL

## 2015-10-23 MED ORDER — B COMPLEX PO TABS: 1.0000 | ORAL_TABLET | Freq: Every day | ORAL | Status: DC

## 2015-10-23 MED ORDER — PROMETHAZINE HCL 25 MG/ML IJ SOLN: 6.2500 mg | INTRAMUSCULAR | Status: DC | PRN

## 2015-10-23 MED ORDER — GLYCOPYRROLATE 0.2 MG/ML IJ SOLN
INTRAMUSCULAR | Status: AC
  Filled 2015-10-23: qty 2

## 2015-10-23 MED ORDER — SUCCINYLCHOLINE CHLORIDE 20 MG/ML IJ SOLN
INTRAMUSCULAR | Status: AC
  Filled 2015-10-23: qty 1

## 2015-10-23 MED ORDER — NEOSTIGMINE METHYLSULFATE 10 MG/10ML IV SOLN
INTRAVENOUS | Status: AC
  Filled 2015-10-23: qty 1

## 2015-10-23 MED ORDER — NEOSTIGMINE METHYLSULFATE 10 MG/10ML IV SOLN
INTRAVENOUS | Status: AC
  Filled 2015-10-23: qty 2

## 2015-10-23 MED ORDER — MIDAZOLAM HCL 5 MG/5ML IJ SOLN
INTRAMUSCULAR | Status: DC | PRN
  Administered 2015-10-23: 1 mg via INTRAVENOUS

## 2015-10-23 MED ORDER — MIDAZOLAM HCL 2 MG/2ML IJ SOLN
INTRAMUSCULAR | Status: AC
  Filled 2015-10-23: qty 4

## 2015-10-23 MED ORDER — PROPOFOL 10 MG/ML IV BOLUS
INTRAVENOUS | Status: DC | PRN
  Administered 2015-10-23: 200 mg via INTRAVENOUS
  Administered 2015-10-23: 30 mg via INTRAVENOUS

## 2015-10-23 MED ORDER — 0.9 % SODIUM CHLORIDE (POUR BTL) OPTIME
TOPICAL | Status: DC | PRN
  Administered 2015-10-23: 1000 mL

## 2015-10-23 MED ORDER — METHOCARBAMOL 500 MG PO TABS
500.0000 mg | ORAL_TABLET | Freq: Four times a day (QID) | ORAL | Status: DC | PRN
  Administered 2015-10-25: 500 mg via ORAL
  Filled 2015-10-23 (×2): qty 1

## 2015-10-23 MED ORDER — POLYETHYLENE GLYCOL 3350 17 G PO PACK
17.0000 g | PACK | Freq: Every day | ORAL | Status: DC | PRN
  Administered 2015-10-25: 17 g via ORAL
  Filled 2015-10-23: qty 1

## 2015-10-23 MED ORDER — ONDANSETRON HCL 4 MG/2ML IJ SOLN
4.0000 mg | INTRAMUSCULAR | Status: DC | PRN
  Administered 2015-10-23 (×2): 4 mg via INTRAVENOUS
  Filled 2015-10-23 (×2): qty 2

## 2015-10-23 MED ORDER — HYDROMORPHONE HCL 1 MG/ML IJ SOLN
INTRAMUSCULAR | Status: AC
  Filled 2015-10-23: qty 1

## 2015-10-23 MED ORDER — ALUM & MAG HYDROXIDE-SIMETH 200-200-20 MG/5ML PO SUSP: 30.0000 mL | Freq: Four times a day (QID) | ORAL | Status: DC | PRN

## 2015-10-23 MED ORDER — NEOSTIGMINE METHYLSULFATE 10 MG/10ML IV SOLN
INTRAVENOUS | Status: DC | PRN
  Administered 2015-10-23: 5 mg via INTRAVENOUS

## 2015-10-23 MED ORDER — ACETAMINOPHEN 325 MG PO TABS
650.0000 mg | ORAL_TABLET | ORAL | Status: DC | PRN
Start: 2015-10-23 — End: 2015-10-26

## 2015-10-23 MED ORDER — FENTANYL CITRATE (PF) 100 MCG/2ML IJ SOLN
INTRAMUSCULAR | Status: DC | PRN
  Administered 2015-10-23: 25 ug via INTRAVENOUS
  Administered 2015-10-23: 75 ug via INTRAVENOUS
  Administered 2015-10-23: 50 ug via INTRAVENOUS
  Administered 2015-10-23: 25 ug via INTRAVENOUS
  Administered 2015-10-23: 50 ug via INTRAVENOUS
  Administered 2015-10-23: 25 ug via INTRAVENOUS

## 2015-10-23 MED ORDER — FLEET ENEMA 7-19 GM/118ML RE ENEM: 1.0000 | ENEMA | Freq: Once | RECTAL | Status: DC | PRN

## 2015-10-23 MED ORDER — PANTOPRAZOLE SODIUM 40 MG IV SOLR
40.0000 mg | Freq: Every day | INTRAVENOUS | Status: DC
  Administered 2015-10-23: 40 mg via INTRAVENOUS
  Filled 2015-10-23: qty 40

## 2015-10-23 MED ORDER — GABAPENTIN 600 MG PO TABS
1200.0000 mg | ORAL_TABLET | Freq: Three times a day (TID) | ORAL | Status: DC
Start: 2015-10-23 — End: 2015-10-26
  Administered 2015-10-23 – 2015-10-26 (×9): 1200 mg via ORAL
  Filled 2015-10-23 (×9): qty 2

## 2015-10-23 MED ORDER — BUPIVACAINE HCL (PF) 0.5 % IJ SOLN
INTRAMUSCULAR | Status: DC | PRN
  Administered 2015-10-23: 17 mL

## 2015-10-23 MED ORDER — SODIUM CHLORIDE 0.9 % IJ SOLN
INTRAMUSCULAR | Status: AC
  Filled 2015-10-23: qty 10

## 2015-10-23 MED ORDER — THROMBIN 20000 UNITS EX SOLR
CUTANEOUS | Status: DC | PRN
  Administered 2015-10-23: 09:00:00 via TOPICAL

## 2015-10-23 MED ORDER — DEXAMETHASONE SODIUM PHOSPHATE 4 MG/ML IJ SOLN
INTRAMUSCULAR | Status: DC | PRN
Start: 2015-10-23 — End: 2015-10-23
  Administered 2015-10-23: 4 mg via INTRAVENOUS

## 2015-10-23 MED ORDER — VITAMIN C 500 MG PO TABS
2000.0000 mg | ORAL_TABLET | Freq: Every day | ORAL | Status: DC
  Administered 2015-10-24 – 2015-10-26 (×3): 2000 mg via ORAL
  Filled 2015-10-23 (×3): qty 4

## 2015-10-23 MED ORDER — METHYLCOBALAMIN 1000 MCG PO LOZG: 5000.0000 ug | LOZENGE | Freq: Every day | ORAL | Status: DC

## 2015-10-23 MED ORDER — GLYCOPYRROLATE 0.2 MG/ML IJ SOLN
INTRAMUSCULAR | Status: AC
  Filled 2015-10-23: qty 1

## 2015-10-23 MED ORDER — CEFAZOLIN SODIUM 10 G IJ SOLR
3.0000 g | Freq: Three times a day (TID) | INTRAMUSCULAR | Status: DC
  Filled 2015-10-23 (×3): qty 3000

## 2015-10-23 MED ORDER — DEXTROSE 5 % IV SOLN
10.0000 mg | INTRAVENOUS | Status: DC | PRN
  Administered 2015-10-23: 50 ug/min via INTRAVENOUS
  Administered 2015-10-23: 11:00:00 via INTRAVENOUS

## 2015-10-23 MED ORDER — HYDROMORPHONE HCL 1 MG/ML IJ SOLN
0.2500 mg | INTRAMUSCULAR | Status: DC | PRN
  Administered 2015-10-23 (×3): 0.5 mg via INTRAVENOUS

## 2015-10-23 MED ORDER — OXYCODONE-ACETAMINOPHEN 5-325 MG PO TABS
1.0000 | ORAL_TABLET | ORAL | Status: DC | PRN
  Administered 2015-10-23 – 2015-10-25 (×4): 2 via ORAL
  Administered 2015-10-25 – 2015-10-26 (×2): 1 via ORAL
  Filled 2015-10-23 (×5): qty 2

## 2015-10-23 MED ORDER — EPHEDRINE SULFATE 50 MG/ML IJ SOLN
INTRAMUSCULAR | Status: DC | PRN
  Administered 2015-10-23: 5 mg via INTRAVENOUS
  Administered 2015-10-23: 10 mg via INTRAVENOUS
  Administered 2015-10-23: 5 mg via INTRAVENOUS
  Administered 2015-10-23: 10 mg via INTRAVENOUS
  Administered 2015-10-23: 5 mg via INTRAVENOUS

## 2015-10-23 MED ORDER — SODIUM CHLORIDE 0.9 % IJ SOLN: 3.0000 mL | INTRAMUSCULAR | Status: DC | PRN

## 2015-10-23 MED ORDER — GLYCOPYRROLATE 0.2 MG/ML IJ SOLN
INTRAMUSCULAR | Status: DC | PRN
Start: 2015-10-23 — End: 2015-10-23
  Administered 2015-10-23: .8 mg via INTRAVENOUS

## 2015-10-23 MED ORDER — NIACIN 100 MG PO TABS: 200.0000 mg | ORAL_TABLET | Freq: Every day | ORAL | Status: DC

## 2015-10-23 MED ORDER — ONDANSETRON HCL 4 MG/2ML IJ SOLN
INTRAMUSCULAR | Status: DC | PRN
  Administered 2015-10-23 (×2): 4 mg via INTRAVENOUS

## 2015-10-23 MED ORDER — BISACODYL 10 MG RE SUPP
10.0000 mg | Freq: Every day | RECTAL | Status: DC | PRN
  Administered 2015-10-25: 10 mg via RECTAL
  Filled 2015-10-23 (×2): qty 1

## 2015-10-23 MED FILL — Sodium Chloride IV Soln 0.9%: INTRAVENOUS | Qty: 1000 | Status: AC

## 2015-10-23 MED FILL — Sodium Chloride Irrigation Soln 0.9%: Qty: 3000 | Status: AC

## 2015-10-23 MED FILL — Heparin Sodium (Porcine) Inj 1000 Unit/ML: INTRAMUSCULAR | Qty: 30 | Status: AC

## 2015-10-23 SURGICAL SUPPLY — 82 items
BENZOIN TINCTURE PRP APPL 2/3 (GAUZE/BANDAGES/DRESSINGS) IMPLANT
BLADE CLIPPER SURG (BLADE) ×3 IMPLANT
BONE CANC CHIPS 40CC CAN1/2 (Bone Implant) ×3 IMPLANT
BUR MATCHSTICK NEURO 3.0 LAGG (BURR) ×3 IMPLANT
BUR PRECISION FLUTE 5.0 (BURR) ×6 IMPLANT
CAGE COROENT LG 10X9X23-12 (Cage) ×6 IMPLANT
CANISTER SUCT 3000ML PPV (MISCELLANEOUS) ×3 IMPLANT
CHIPS CANC BONE 40CC CAN1/2 (Bone Implant) ×1 IMPLANT
CLOSURE WOUND 1/2 X4 (GAUZE/BANDAGES/DRESSINGS) ×1
CONT SPEC 4OZ CLIKSEAL STRL BL (MISCELLANEOUS) ×6 IMPLANT
COVER BACK TABLE 60X90IN (DRAPES) ×3 IMPLANT
DECANTER SPIKE VIAL GLASS SM (MISCELLANEOUS) ×3 IMPLANT
DERMABOND ADVANCED (GAUZE/BANDAGES/DRESSINGS) ×4
DERMABOND ADVANCED .7 DNX12 (GAUZE/BANDAGES/DRESSINGS) ×2 IMPLANT
DRAPE C-ARM 42X72 X-RAY (DRAPES) ×6 IMPLANT
DRAPE C-ARMOR (DRAPES) ×3 IMPLANT
DRAPE LAPAROTOMY 100X72X124 (DRAPES) ×3 IMPLANT
DRAPE POUCH INSTRU U-SHP 10X18 (DRAPES) ×3 IMPLANT
DRAPE SURG 17X23 STRL (DRAPES) ×3 IMPLANT
DRSG OPSITE POSTOP 4X10 (GAUZE/BANDAGES/DRESSINGS) ×3 IMPLANT
DURAPREP 26ML APPLICATOR (WOUND CARE) ×3 IMPLANT
ELECT BLADE 4.0 EZ CLEAN MEGAD (MISCELLANEOUS) ×3
ELECT REM PT RETURN 9FT ADLT (ELECTROSURGICAL) ×3
ELECTRODE BLDE 4.0 EZ CLN MEGD (MISCELLANEOUS) ×1 IMPLANT
ELECTRODE REM PT RTRN 9FT ADLT (ELECTROSURGICAL) ×1 IMPLANT
EVACUATOR 1/8 PVC DRAIN (DRAIN) ×3 IMPLANT
GAUZE SPONGE 4X4 12PLY STRL (GAUZE/BANDAGES/DRESSINGS) IMPLANT
GAUZE SPONGE 4X4 16PLY XRAY LF (GAUZE/BANDAGES/DRESSINGS) ×3 IMPLANT
GLOVE BIO SURGEON STRL SZ8 (GLOVE) ×6 IMPLANT
GLOVE BIOGEL PI IND STRL 8 (GLOVE) ×2 IMPLANT
GLOVE BIOGEL PI IND STRL 8.5 (GLOVE) ×2 IMPLANT
GLOVE BIOGEL PI INDICATOR 8 (GLOVE) ×4
GLOVE BIOGEL PI INDICATOR 8.5 (GLOVE) ×4
GLOVE ECLIPSE 8.0 STRL XLNG CF (GLOVE) ×6 IMPLANT
GLOVE EXAM NITRILE LRG STRL (GLOVE) IMPLANT
GLOVE EXAM NITRILE MD LF STRL (GLOVE) IMPLANT
GLOVE EXAM NITRILE XL STR (GLOVE) IMPLANT
GLOVE EXAM NITRILE XS STR PU (GLOVE) IMPLANT
GOWN STRL REUS W/ TWL LRG LVL3 (GOWN DISPOSABLE) IMPLANT
GOWN STRL REUS W/ TWL XL LVL3 (GOWN DISPOSABLE) ×3 IMPLANT
GOWN STRL REUS W/TWL 2XL LVL3 (GOWN DISPOSABLE) IMPLANT
GOWN STRL REUS W/TWL LRG LVL3 (GOWN DISPOSABLE)
GOWN STRL REUS W/TWL XL LVL3 (GOWN DISPOSABLE) ×6
KIT BASIN OR (CUSTOM PROCEDURE TRAY) ×3 IMPLANT
KIT INFUSE MEDIUM (Orthopedic Implant) ×3 IMPLANT
KIT POSITION SURG JACKSON T1 (MISCELLANEOUS) ×3 IMPLANT
KIT ROOM TURNOVER OR (KITS) ×3 IMPLANT
MILL MEDIUM DISP (BLADE) ×3 IMPLANT
NEEDLE HYPO 21X1.5 SAFETY (NEEDLE) ×3 IMPLANT
NEEDLE HYPO 25X1 1.5 SAFETY (NEEDLE) ×3 IMPLANT
NEEDLE SPNL 18GX3.5 QUINCKE PK (NEEDLE) ×3 IMPLANT
NS IRRIG 1000ML POUR BTL (IV SOLUTION) ×3 IMPLANT
PACK FOAM VITOSS 10CC (Orthopedic Implant) ×9 IMPLANT
PACK LAMINECTOMY NEURO (CUSTOM PROCEDURE TRAY) ×3 IMPLANT
PAD ARMBOARD 7.5X6 YLW CONV (MISCELLANEOUS) ×9 IMPLANT
PATTIES SURGICAL .5 X.5 (GAUZE/BANDAGES/DRESSINGS) IMPLANT
PATTIES SURGICAL .5 X1 (DISPOSABLE) IMPLANT
PATTIES SURGICAL 1X1 (DISPOSABLE) IMPLANT
ROD RELINE-O 5.5X120MM LORD (Rod) ×6 IMPLANT
SCREW LOCK RELINE 5.5 TULIP (Screw) ×30 IMPLANT
SCREW RELINE-O POLY 6.5X50MM (Screw) ×6 IMPLANT
SCREW RELINE-O POLY 7.5X40 (Screw) ×6 IMPLANT
SCREW RELINE-O POLY 7.5X45 (Screw) ×21 IMPLANT
SCREW RELINE-O POLY 7.5X50 (Screw) ×2 IMPLANT
SCREW RLINE PLY 2S 50X7.5XPA (Screw) ×1 IMPLANT
SPONGE LAP 4X18 X RAY DECT (DISPOSABLE) ×6 IMPLANT
SPONGE SURGIFOAM ABS GEL 100 (HEMOSTASIS) ×3 IMPLANT
STAPLER SKIN PROX WIDE 3.9 (STAPLE) IMPLANT
STRIP CLOSURE SKIN 1/2X4 (GAUZE/BANDAGES/DRESSINGS) ×2 IMPLANT
SUT VIC AB 1 CT1 18XBRD ANBCTR (SUTURE) ×2 IMPLANT
SUT VIC AB 1 CT1 8-18 (SUTURE) ×4
SUT VIC AB 2-0 CT1 18 (SUTURE) ×6 IMPLANT
SUT VIC AB 3-0 SH 8-18 (SUTURE) ×9 IMPLANT
SYR 20CC LL (SYRINGE) ×3 IMPLANT
SYR 3ML LL SCALE MARK (SYRINGE) ×12 IMPLANT
SYR 5ML LL (SYRINGE) IMPLANT
TOWEL OR 17X24 6PK STRL BLUE (TOWEL DISPOSABLE) ×3 IMPLANT
TOWEL OR 17X26 10 PK STRL BLUE (TOWEL DISPOSABLE) ×3 IMPLANT
TRAP SPECIMEN MUCOUS 40CC (MISCELLANEOUS) ×3 IMPLANT
TRAY FOLEY CATH 16FRSI W/METER (SET/KITS/TRAYS/PACK) ×3 IMPLANT
TRAY FOLEY W/METER SILVER 14FR (SET/KITS/TRAYS/PACK) IMPLANT
WATER STERILE IRR 1000ML POUR (IV SOLUTION) ×3 IMPLANT

## 2015-10-23 NOTE — Transfer of Care (Signed)
Immediate Anesthesia Transfer of Care Note  Patient: Dustin Keith  Procedure(s) Performed: Procedure(s) with comments: Lumbar two to three, Lumbar five to sacral one redo decompression/ fusion with exploration of lumbar three to five (N/A) - L2-3 L5-S1 Redo Decompression/Fusion with exploration of L3-5  Patient Location: PACU  Anesthesia Type:General  Level of Consciousness: sedated and patient cooperative  Airway & Oxygen Therapy: Patient Spontanous Breathing and Patient connected to nasal cannula oxygen  Post-op Assessment: Report given to RN, Post -op Vital signs reviewed and stable and Patient moving all extremities  Post vital signs: Reviewed and stable  Last Vitals:  Filed Vitals:   10/23/15 0608  BP: 140/88  Pulse: 72  Temp: 36.9 C  Resp: 20    Complications: No apparent anesthesia complications

## 2015-10-23 NOTE — Interval H&P Note (Signed)
History and Physical Interval Note:  10/23/2015 7:12 AM  Dustin Keith  has presented today for surgery, with the diagnosis of Lumbar radiculopathy  The various methods of treatment have been discussed with the patient and family. After consideration of risks, benefits and other options for treatment, the patient has consented to  Procedure(s) with comments: L2-3 L5-S1 Redo Decompression/Fusion with exploration of L3-5 (N/A) - L2-3 L5-S1 Redo Decompression/Fusion with exploration of L3-5 as a surgical intervention .  The patient's history has been reviewed, patient examined, no change in status, stable for surgery.  I have reviewed the patient's chart and labs.  Questions were answered to the patient's satisfaction.     [name] D

## 2015-10-23 NOTE — H&P (Signed)
Patient ID:   [id number] Patient: Dustin Keith  Date of Birth: 29-Sep-1946 Visit Type: Office Visit   Date: 07/30/2015 10:15 AM Provider: Marchia Meiers. Vertell Limber MD   This 69 year old male presents for back pain.  History of Present Illness: 1.  back pain  Patient returns to review his MRI  Lumbar MRI shows progression of stenosis at L2-3 level worse on the left.  There also appears to be significant stenosis and spondylosis at the L5-S1 level.  The patient has severe back and bilateral lower knee pain, left greater than right.  He currently describes his pain at 6 out of 10 in severity.  He is quite miserable.  He says he is not able to get relief.  He says he is no longer having right leg pain which was his previous symptomatic for which he had microdiscectomy at the L5-S1 level performed.  At this point I have recommended to the patient that he undergo redo decompression with fusion L2-3 and L5-S1 levels with exploration of fusion at L3 through L5 levels.  Patient was fitted for LSO brace today.  Patient is aware of potential risks and benefits and wishes to proceed with surgery.      Medical/Surgical/Interim History Reviewed, no change.  Last detailed document date:04/21/2015.   PAST MEDICAL HISTORY, SURGICAL HISTORY, FAMILY HISTORY, SOCIAL HISTORY AND REVIEW OF SYSTEMS I have reviewed the patient's past medical, surgical, family and social history as well as the comprehensive review of systems as included on the [facility] history form dated 04/21/2015, which I have signed.  Family History: Reviewed, no changes.  Last detailed document: 04/21/2015.   Social History: Tobacco use reviewed. Reviewed, no changes. Last detailed document date: 04/21/2015.      MEDICATIONS(added, continued or stopped this visit): Started Medication Directions Instruction Stopped   amlodipine 10 mg tablet take 1 tablet by oral route  every day     Depo-Testosterone  inject 1 milliliter by intramuscular route  every 2 weeks     Norco 7.5 mg-325 mg tablet take 0.5 tablet by oral route as needed     Synthroid 100 mcg tablet take 1 tablet by oral route  every day       ALLERGIES: Ingredient Reaction Medication Name Comment  NO KNOWN ALLERGIES     No known allergies. Reviewed, no changes.    Vitals Date Temp F BP Pulse Ht In Wt Lb BMI BSA Pain Score  07/30/2015  148/80 83 69 256 37.8  6/10      IMPRESSION Patient is having progressive worsening of symptomatology.  Plan is to proceed with exploration of L3 through L5 fusion with decompression and fusion L2-3 and L5-S1 levels.  Completed Orders (this encounter) Order Details Reason Side Interpretation Result Initial Treatment Date Region  Hypertension education Follow up with primary care physician.        Lifestyle education regarding diet Encouraged to eat a well balanced diet and follow up with primary care physician.         Assessment/Plan # Detail Type Description   1. Assessment Spinal stenosis of lumbar region (M48.06).       2. Assessment Low back pain, unspecified back pain laterality, with sciatica presence unspecified (M54.5).       3. Assessment Spinal stenosis of lumbosacral region (M48.07).       4. Assessment Lumbar radiculopathy (M54.16).       5. Assessment Essential (primary) hypertension (I10).  6. Assessment Body mass index (BMI) 37.0-37.9, adult (Z68.37).   Plan Orders Today's instructions / counseling include(s) Lifestyle education regarding diet.         Pain Assessment/Treatment Pain Scale: 6/10. Method: Numeric Pain Intensity Scale. Location: back. Onset: 04/20/2014. Duration: varies. Quality: discomforting. Pain Assessment/Treatment follow-up plan of care: Patient is taking medications as prescribed..  Fall Risk Plan The patient has not fallen in the last year.  Risks and benefits were discussed in detail with the patient and he wishes to  proceed with surgery.  Orders: Diagnostic Procedures: Assessment Procedure  M54.16  L2-L3 - L5-S1 redo decompression + fusion, with exploration L3-L5  Instruction(s)/Education: Assessment Instruction  I10 Hypertension education  [phone] Lifestyle education regarding diet             Provider:  Marchia Meiers. Vertell Limber MD  08/02/2015 04:48 PM Dictation edited by: Marchia Meiers. Vertell Limber    CC Providers: Mosetta Anis [facility] [address], [place] [address]-              Electronically signed by Marchia Meiers. Vertell Limber MD on 08/02/2015 04:48 PM  Patient ID:   [id number] Patient: Dustin Keith  Date of Birth: 1946/08/27 Visit Type: Office Visit   Date: 07/21/2015 10:00 AM Provider: Marchia Meiers. Vertell Limber MD   This 69 year old male presents for back pain.  History of Present Illness: 1.  back pain  04/30/15 and right L5-S1 microdiscectomy  Patient reports increased lumbar bilateral lower extremity aches approximately two weeks after his last visit.  Primary care Dr. Sabra Heck obtained EMG/NCS finding radiculopathies L5-S1 bilaterally.  Patient reports stopping caffeine and essentially gluten as well, with some reduction in leg pain.  He states his legs still feel "weak".   Gabapentin 600 mg 3 times a day causes him to feel sleepy and has caused significant weight gain Tylenol arthritis taken as needed Norco is taken only occasionally at bedtime  Patient is complaining of increasing pain since the last visit and notes that it is in both of his legs.  He is not doing as well as he was at the time of his last visit.  On examination the patient does not appear to have focal weakness but he does complain of pain into both of his legs.      Medical/Surgical/Interim History Reviewed, no change.  Last detailed document date:04/21/2015.   PAST MEDICAL HISTORY, SURGICAL HISTORY, FAMILY HISTORY, SOCIAL HISTORY AND REVIEW OF SYSTEMS I have reviewed the patient's past  medical, surgical, family and social history as well as the comprehensive review of systems as included on the [facility] history form dated 04/21/2015, which I have signed.  Family History: Reviewed, no changes.  Last detailed document: 04/21/2015.   Social History: Tobacco use reviewed. Reviewed, no changes. Last detailed document date: 04/21/2015.      MEDICATIONS(added, continued or stopped this visit): Started Medication Directions Instruction Stopped   amlodipine 10 mg tablet take 1 tablet by oral route  every day     Depo-Testosterone inject 1 milliliter by intramuscular route  every 2 weeks     Norco 7.5 mg-325 mg tablet take 0.5 tablet by oral route as needed     Synthroid 100 mcg tablet take 1 tablet by oral route  every day       ALLERGIES: Ingredient Reaction Medication Name Comment  NO KNOWN ALLERGIES     No known allergies. Reviewed, no changes.    Vitals Date Temp  F BP Pulse Ht In Wt Lb BMI BSA Pain Score  07/21/2015  131/76 92 69 258 38.1  3/10      IMPRESSION The patient is having a difficult time getting around.  He is having increased pain.  He had EMG and nerve conduction studies which demonstrate active bilateral S1 radiculopathies.  Assessment/Plan # Detail Type Description   1. Assessment Low back pain, unspecified back pain laterality, with sciatica presence unspecified (M54.5).       2. Assessment Spinal stenosis of lumbar region (M48.06).       3. Assessment Lumbar radiculopathy (M54.16).       4. 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         Pain Assessment/Treatment Location: back. Onset: 04/20/2014. Duration: varies. Quality: discomforting. Pain Assessment/Treatment follow-up plan of care: Patient is taking medications as prescribed..  I recommended the patient undergo repeat imaging of the lumbar spine which will consist of MRI of the lumbar spine with and without gadolinium.   The patient will return to see me after that is done.  Orders: Diagnostic Procedures: Assessment Procedure  M48.06 Return to Clinic after study is performed  M54.16 MRI Spine/lumb With & W/o Contrast             Provider:  Marchia Meiers. Vertell Limber MD  07/27/2015 05:40 PM Dictation edited by: Marchia Meiers. Vertell Limber    CC Providers: Mosetta Anis [facility] [address] [place] [address]-              Electronically signed by Marchia Meiers. Vertell Limber MD on 07/27/2015 05:40 PM  Patient ID:   [id number] Patient: Dustin Keith  Date of Birth: 29-Nov-1946 Visit Type: Office Visit   Date: 05/21/2015 10:15 AM Provider: Marchia Meiers. Vertell Limber MD   This 68 year old male presents for back pain and Leg pain.  History of Present Illness: 1.  back pain  2.  Leg pain  04/30/15 right L5-S1 microdiscectomy   Patient visits for his first preop stating pain level is much improved.  He reports only occasional mild pain right piriformis region and right lateral calf.  He is walking daily.  He has stopped Norco.  His incision has healed nicely.  His strength is full bilaterally.  Tylenol 500 mg two tablets twice daily Robaxin 500 mg twice daily      Medical/Surgical/Interim History Reviewed, no change.  Last detailed document date:04/21/2015.   PAST MEDICAL HISTORY, SURGICAL HISTORY, FAMILY HISTORY, SOCIAL HISTORY AND REVIEW OF SYSTEMS I have reviewed the patient's past medical, surgical, family and social history as well as the comprehensive review of systems as included on the [facility] history form dated 04/21/2015, which I have signed.  Family History: Reviewed, no changes.  Last detailed document: 04/21/2015.   Social History: Tobacco use reviewed. Reviewed, no changes. Last detailed document date: 04/21/2015.      MEDICATIONS(added, continued or stopped this visit): Started Medication Directions Instruction Stopped   amlodipine  10 mg tablet take 1 tablet by oral route  every day     Depo-Testosterone inject 1 milliliter by intramuscular route  every 2 weeks     Norco 7.5 mg-325 mg tablet take 0.5 tablet by oral route as needed     Synthroid 100 mcg tablet take 1 tablet by oral route  every day       ALLERGIES: Ingredient Reaction Medication Name Comment  NO KNOWN ALLERGIES     No known allergies.  Reviewed, no changes.    Vitals Date Temp F BP Pulse Ht In Wt Lb BMI BSA Pain Score  05/21/2015  150/81 86 69 249 36.77  1/10      IMPRESSION Prince Couey is doing very well.  Completed Orders (this encounter) Order Details Reason Side Interpretation Result Initial Treatment Date Region  Hypertension education Follow up with primary care physician.        Lifestyle education regarding diet Encouraged to eat a well balanced diet and follow up with primary care physician.         Assessment/Plan # Detail Type Description   1. 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       2. Assessment Lumbar radiculopathy (M54.16).       3. Assessment Spinal stenosis of lumbar region (M48.06).       4. Assessment Low back pain, unspecified back pain laterality, with sciatica presence unspecified (M54.5).       5. Assessment Essential (primary) hypertension (I10).       6. Assessment Body mass index (BMI) 36.0-36.9, adult (Z68.36).   Plan Orders Today's instructions / counseling include(s) Lifestyle education regarding diet.         Pain Assessment/Treatment Pain Scale: 1/10. Method: Numeric Pain Intensity Scale. Location: back/leg. Onset: 04/20/2014. Duration: varies. Quality: discomforting. Pain Assessment/Treatment follow-up plan of care: Patient is taking medications as prescribed..  Fall Risk Plan The patient has not fallen in the last year.  He will return in six weeks for evaluation.  Orders: Instruction(s)/Education: Assessment Instruction  I10 Hypertension education   [phone] Lifestyle education regarding diet             Provider:  Marchia Meiers. Vertell Limber MD  05/27/2015 10:28 AM Dictation edited by: Mike Craze. Poteat RN    CC Providers: Mosetta Anis [facility] [address], [place] [address]-              Electronically signed by Marchia Meiers. Vertell Limber MD on 05/28/2015 10:58 AM  Patient ID:   [id number] Patient: Dustin Keith  Date of Birth: 09-07-1946 Visit Type: Office Visit   Date: 04/21/2015 10:30 AM Provider: Marchia Meiers. Vertell Limber MD   This 69 year old male presents for back pain and Leg pain.  History of Present Illness: 1.  back pain  2.  Leg pain  The patient comes in complaining of right low back and right leg pain into his foot.  He says that this is unbearable.  He is becoming progressively more uncomfortable to the point where he says his leg pain is unrelenting.  The patient had previously undergone L3 through L5 laminectomy with posterior fusion L3 through L5 levels and did well with that.  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  The patient became symptomatic after a trip to [place] and he is subsequently stopped traveling because of the severity of his pain.  The patient is currently 5 feet 9 inches tall and weighs 245 pounds.  A lumbar MRI scan demonstrates a right-sided disc herniation at L5-S1 with right S1 nerve root compression.  Addition he has solid fusion based on review of radiographs and well decompressed thecal sac and nerve roots on his MRI at the L3 through L5 levels.  He is developing stenosis eccentric to the left at the L2-3 level.        PAST MEDICAL/SURGICAL HISTORY   (Detailed)  Disease/disorder Onset Date Management Date Comments  Surgery, lumbar spine 2012   Hypertension      Thyroid disease         PAST MEDICAL HISTORY, SURGICAL HISTORY, FAMILY HISTORY, SOCIAL HISTORY AND REVIEW OF SYSTEMS I have reviewed the patient's  past medical, surgical, family and social history as well as the comprehensive review of systems as included on the [facility] history form dated 04/21/2015, which I have signed.  Family History  (Detailed) Relationship Family Member Name Deceased Age at Death Condition Onset Age Cause of Death  Mother    Melanoma  N    SOCIAL HISTORY  (Detailed) Tobacco use reviewed. Preferred language is Unknown.   Smoking status: Never smoker.  SMOKING STATUS Use Status Type Smoking Status Usage Per Day Years Used Total Pack Years  no/never  Never smoker             MEDICATIONS(added, continued or stopped this visit): Started Medication Directions Instruction Stopped   amlodipine 10 mg tablet take 1 tablet by oral route  every day     Depo-Testosterone inject 1 milliliter by intramuscular route  every 2 weeks     Norco 7.5 mg-325 mg tablet take 0.5 tablet by oral route as needed     Synthroid 100 mcg tablet take 1 tablet by oral route  every day       ALLERGIES: Ingredient Reaction Medication Name Comment  NO KNOWN ALLERGIES     No known allergies.    Vitals Date Temp F BP Pulse Ht In Wt Lb BMI BSA Pain Score  04/21/2015  132/80 90 69 252 37.21  7/10     PHYSICAL EXAM General Level of Distress: no acute distress Overall Appearance: normal    Cardiovascular Cardiac: regular rate and rhythm without murmur  Respiratory Lungs: clear to auscultation  Neurological Recent and Remote Memory: normal Attention Span and Concentration:   normal Language: normal Fund of Knowledge: normal  Right Left Sensation: normal normal Upper Extremity Coordination: normal normal  Lower Extremity Coordination: normal normal  Musculoskeletal Gait and Station: normal  Right Left Upper Extremity Muscle Strength: normal normal Lower Extremity Muscle Strength: normal normal Upper Extremity Muscle Tone:  normal normal Lower Extremity Muscle  Tone: normal normal  Motor Strength Upper and lower extremity motor strength was tested in the clinically pertinent muscles.     Deep Tendon Reflexes  Right Left Biceps: normal normal Triceps: normal normal Brachiloradialis: normal normal Patellar: normal normal Achilles: absent normal  Sensory Sensation was tested at L1 to S1. Any abnormal findings will be noted below.  Right Left S1: decreased   Cranial Nerves II. Optic Nerve/Visual Fields: normal III. Oculomotor: normal IV. Trochlear: normal V. Trigeminal: normal VI. Abducens: normal VII. Facial: normal VIII. Acoustic/Vestibular: normal IX. Glossopharyngeal: normal X. Vagus: normal XI. Spinal Accessory: normal XII. Hypoglossal: normal  Motor and other Tests Lhermittes: negative Rhomberg: negative    Right Left Hoffman's: normal normal Clonus: normal normal Babinski: normal normal SLR: positive at 20 degrees negative Patrick's Corky Sox): negative negative Toe Walk: normal normal Toe Lift: normal normal Heel Walk: normal normal SI Joint: nontender nontender   Additional Findings:  Patient is not able to stand on his toes on the right.  He has decreased sensation on the right at the S1 distribution.    IMPRESSION The patient has a large disc herniation at L5-S1 on the right.  I believe this is causing him a right S1 radiculopathy.  I have recommended to the patient did undergo right  L5-S1 microdiscectomy and this is been set up at  [facility] on 04/30/[facility].  Risks and benefits of surgery discussed in detail with the patient and he wishes to proceed.  He is aware that he might need extension of fusion if he were to develop recurrent disc herniation at this level and also that he has significant degenerative changes of the L2-3 level which might require surgical intervention as well.  Currently this area is not symptomatic, and I recommended that nothing be done at this level.  Completed Orders (this encounter) Order  Details Reason Side Interpretation Result Initial Treatment Date Region  Lumbar Spine- AP/Lat/Flex/Ex      04/21/2015 All Levels to All Levels   Assessment/Plan # Detail Type Description   1. 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       2. Assessment Low back pain, unspecified back pain laterality, with sciatica presence unspecified (M54.5).       3. Assessment Spinal stenosis of lumbar region (M48.06).       4. Assessment Lumbar radiculopathy (M54.16).       5. 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       6. Assessment Body mass index (BMI) 37.0-37.9, adult (Z68.37).   Plan Orders Today's instructions / counseling include(s) Lifestyle education regarding diet.         Pain Assessment/Treatment Pain Scale: 7/10. Method: Numeric Pain Intensity Scale. Location: back/right leg. Onset: 04/20/2014. Duration: varies. Quality: discomforting. Pain Assessment/Treatment follow-up plan of care: Patient is taking medications as prescribed..  Right L5-S1 microdiscectomy on 04/30/15  Orders: Diagnostic Procedures: Assessment Procedure  M51.27 Microdiscectomy - right - L5-S1  M54.16 Lumbar Spine- AP/Lat/Flex/Ex  Instruction(s)/Education: Assessment Instruction  Z68.37 Lifestyle education regarding diet             Provider:  Marchia Meiers. Vertell Limber MD  04/22/2015 02:07 PM Dictation edited by: Marchia Meiers. Vertell Limber    CC Providers: Mosetta Anis [facility] [address], [place] [address]-              Electronically signed by Marchia Meiers. Vertell Limber MD on 04/22/2015 02:08 PM

## 2015-10-23 NOTE — Progress Notes (Signed)
CPAP was set at 14cm H2O per pt.'s home regimen. Pt. Has brought his nasal mask from home. Pt. Began to feel nauseous, RT instructed pt. Not to place himself on CPAP as long as he was feeling sick. Pt. Is aware to call when he is ready to be placed on CPAP.

## 2015-10-23 NOTE — Op Note (Signed)
10/23/2015  6:24 PM  PATIENT:  Dustin Keith  69 y.o. male  PRE-OPERATIVE DIAGNOSIS:  Herniated lumbar disc, lumbar spinal stenosis, Lumbar radiculopathy, Lumbago L23 and L 5 S1 levels  POST-OPERATIVE DIAGNOSIS:  Herniated lumbar disc, lumbar spinal stenosis, Lumbar radiculopathy, Lumbago L23 and L 5 S1 levels  PROCEDURE:  Procedure(s) with comments: Lumbar two to three, Lumbar five to sacral one redo decompression/ fusion with exploration of lumbar three to five (N/A) - L2-3 L5-S1 Redo Decompression/Fusion with exploration of L3-5   Decompression in excess of standard PLIF procedure  Pedicle screw fixation L 2-S1 levels  Posterolateral arthrodesis L 2 - S 1 levels  SURGEON:  Surgeon(s) and Role:    * Erline Levine, MD - Primary  PHYSICIAN ASSISTANT:   ASSISTANTS: Poteat, RN   ANESTHESIA:   general  EBL:  Total I/O In: 3753 [I.V.:3453; Blood:300] Out: 1370 [Urine:320; Drains:300; Blood:750]  BLOOD ADMINISTERED:300 CC PRBC  DRAINS: (Medium) Hemovact drain(s) in the epidural space with  Suction Open   LOCAL MEDICATIONS USED:  MARCAINE     SPECIMEN:  No Specimen  DISPOSITION OF SPECIMEN:  N/A  COUNTS:  YES  TOURNIQUET:  * No tourniquets in log *  DICTATION: Patient is 69 year old man with lumbar stenosis and recurrent disc herniation and previous fusion L 3-5 levels with stenosis at  L 23 and retrolisthesisrecurrent disc herniation at L 5 S1 level.  It was elected to take him to surgery for exploration of previous fusion with decompression and fusion from L 2 through S 1 levels.   Procedure: Patient was placed in a prone position on the [place] table after smooth and uncomplicated induction of general endotracheal anesthesia. His low back was prepped and draped in usual sterile fashion with betadine scrub and DuraPrep. Area of incision was infiltrated with local lidocaine. Incision was made to the lumbodorsal fascia was incised and exposure was performed of the L 2 - S 1  spinous processes laminae facet joint and transverse processes. Previous hardware was exposed.  This was removed.  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  The remaining screws had good purchase and these were also removed. There did not appear to be a great deal of bone growth at the L 3 - 4 levels.  Intraoperative x-ray was obtained which confirmed correct orientation with marker probes at L2  Through S 1 levles. A total laminectomy of L 2 through L 3  levels was performed with disarticulation of the facet joints and thorough decompression was performed of both  L 2 , L 3   nerve roots along with the common dural tube. Decompression was performed through scar tissue, which was carefully dissected from the dura. Prior laminectomy defect was defined on the right at L 5 S 1 level and a complete laminectomy was performed at this level.  This involved painstaking dissection through scar tissue as well.  A thorough discectomy was performed at L 5 S1 level with removal of  Fragments of disc material and redo discectomy was performed on the right, as well.  This side had a significant amount of scar tissue.  Decompression was greater than in a standard PLIF procedure with removal of herniated disc material, dissection through scar, and complete decompression of both L 5 and S 1 nerve roots and the thecal sac. A thorough discectomy was then performed on the left with preparation of the  endplates for grafting a trial spacer was placed this level and a thorough discectomy was performed on the right as well at L 5 S 1.  After trial sizing and utilization of sequential shavers, interspaces were packed with autograft, BMP and 37mm  X 23 x 12 degree lordotic PEEK cages with 10 cc of autograft in the intrerspace. The posterolateral region was extensively decorticated and pedicle probes were placed at L 2  And S 1 bilaterally. Intraoperative  fluoroscopy confirmed correct orientationin the AP and lateral plane. 45 x 7.5 mm pedicle screws were placed at L 3, 4, 5  bilaterally and 50 x 6.5 mm screws placed at L 2  bilaterally and 7.5 x 45 mm on the right at S 1 and 7.5 x 50 mm on the left at S 1. Final x-rays demonstrated well-positioned interbody grafts and pedicle screw fixation. A 120 mm lordotic rod was placed on the right and a 120 mm rod was placed on the left locked down in situ and the posterolateral region was packed with remaining BMP, Vitoss and autograft bilaterally. The wound was irrigated. A medium Hemovac drain was placed. Fascia was closed with 1 Vicryl sutures skin edges were reapproximated 2 and 3-0 Vicryl sutures. Long-acting marcaine was injected in the subcutaneous tissues.  The wound was dressed with Dermabond and  an occlusive dressing the patient was extubated in the operating room and taken to recovery in stable satisfactory condition he tolerated traction well counts were correct at the end of the case.   PLAN OF CARE: Admit to inpatient   PATIENT DISPOSITION:  PACU - hemodynamically stable.   Delay start of Pharmacological VTE agent (>24hrs) due to surgical blood loss or risk of bleeding: yes

## 2015-10-23 NOTE — Progress Notes (Signed)
Patient ID: Dustin Keith, male   DOB: 1946/08/22, 69 y.o.   MRN: [id number] Waking up, denies leg pain, lumbar pain well controlled. MAEW. Good strength BLE.   Verdis Prime RN BSN

## 2015-10-23 NOTE — Anesthesia Preprocedure Evaluation (Addendum)
Anesthesia Evaluation  Patient identified by MRN, date of birth, ID band Patient awake    Reviewed: Allergy & Precautions, NPO status , Patient's Chart, lab work & pertinent test results  Airway Mallampati: III  TM Distance: >3 FB Neck ROM: Full    Dental no notable dental hx. (+) Teeth Intact, Dental Advisory Given   Pulmonary sleep apnea ,    Pulmonary exam normal breath sounds clear to auscultation       Cardiovascular hypertension, Pt. on medications Normal cardiovascular exam Rhythm:Regular Rate:Normal     Neuro/Psych negative neurological ROS  negative psych ROS   GI/Hepatic negative GI ROS, Neg liver ROS,   Endo/Other  Hypothyroidism   Renal/GU negative Renal ROS     Musculoskeletal  (+) Arthritis ,   Abdominal   Peds  Hematology negative hematology ROS (+)   Anesthesia Other Findings   Reproductive/Obstetrics negative OB ROS                           Anesthesia Physical Anesthesia Plan  ASA: II  Anesthesia Plan: General   Post-op Pain Management:    Induction: Intravenous  Airway Management Planned: Oral ETT  Additional Equipment:   Intra-op Plan:   Post-operative Plan: Extubation in OR  Informed Consent: I have reviewed the patients History and Physical, chart, labs and discussed the procedure including the risks, benefits and alternatives for the proposed anesthesia with the patient or authorized representative who has indicated his/her understanding and acceptance.   Dental advisory given  Plan Discussed with: CRNA  Anesthesia Plan Comments:         Anesthesia Quick Evaluation

## 2015-10-23 NOTE — Anesthesia Procedure Notes (Signed)
Procedure Name: Intubation Date/Time: 10/23/2015 8:08 AM Performed by: Merdis Delay Pre-anesthesia Checklist: Patient identified, Timeout performed, Emergency Drugs available, Suction available and Patient being monitored Patient Re-evaluated:Patient Re-evaluated prior to inductionOxygen Delivery Method: Circle system utilized Preoxygenation: Pre-oxygenation with 100% oxygen Intubation Type: IV induction Ventilation: Mask ventilation without difficulty Laryngoscope Size: Mac and 4 Grade View: Grade II Tube size: 7.5 mm Number of attempts: 1 Placement Confirmation: ETT inserted through vocal cords under direct vision,  breath sounds checked- equal and bilateral,  positive ETCO2 and CO2 detector Secured at: 22 cm Dental Injury: Teeth and Oropharynx as per pre-operative assessment  Difficulty Due To: Difficulty was anticipated Comments: Grade 2 view with MAC 4, cricoid pressure and head lift (large head)

## 2015-10-23 NOTE — Progress Notes (Signed)
Pt ready for CPAP. RT placed pt on previously set pressure of 14cmH20 per pt's home regimen. Pt is using his personal mask and tubing. RT will continue to monitor.

## 2015-10-23 NOTE — Anesthesia Postprocedure Evaluation (Signed)
Anesthesia Post Note  Patient: Dustin Keith  Procedure(s) Performed: Procedure(s) (LRB): Lumbar two to three, Lumbar five to sacral one redo decompression/ fusion with exploration of lumbar three to five (N/A)  Anesthesia type: General  Patient location: PACU  Post pain: Pain level controlled  Post assessment: Post-op Vital signs reviewed  Last Vitals: BP 113/71 mmHg  Pulse 89  Temp(Src) 36.9 C (Oral)  Resp 18  Ht 5\' 9"  (1.753 m)  Wt 256 lb (116.121 kg)  BMI 37.79 kg/m2  SpO2 91%  Post vital signs: Reviewed  Level of consciousness: sedated  Complications: No apparent anesthesia complications

## 2015-10-23 NOTE — Progress Notes (Signed)
Patient arived around 1500, pain under control complaining of nausea, SCD's on IV going will continue to monitor.

## 2015-10-23 NOTE — Brief Op Note (Signed)
10/23/2015  6:24 PM  PATIENT:  Dustin Keith  69 y.o. male  PRE-OPERATIVE DIAGNOSIS:  Herniated lumbar disc, lumbar spinal stenosis, Lumbar radiculopathy, Lumbago L23 and L 5 S1 levels  POST-OPERATIVE DIAGNOSIS:  Herniated lumbar disc, lumbar spinal stenosis, Lumbar radiculopathy, Lumbago L23 and L 5 S1 levels  PROCEDURE:  Procedure(s) with comments: Lumbar two to three, Lumbar five to sacral one redo decompression/ fusion with exploration of lumbar three to five (N/A) - L2-3 L5-S1 Redo Decompression/Fusion with exploration of L3-5   Decompression in excess of standard PLIF procedure  Pedicle screw fixation L 2-S1 levels  Posterolateral arthrodesis L 2 - S 1 levels  SURGEON:  Surgeon(s) and Role:    * Erline Levine, MD - Primary  PHYSICIAN ASSISTANT:   ASSISTANTS: Poteat, RN   ANESTHESIA:   general  EBL:  Total I/O In: 3753 [I.V.:3453; Blood:300] Out: 1370 [Urine:320; Drains:300; Blood:750]  BLOOD ADMINISTERED:300 CC PRBC  DRAINS: (Medium) Hemovact drain(s) in the epidural space with  Suction Open   LOCAL MEDICATIONS USED:  MARCAINE     SPECIMEN:  No Specimen  DISPOSITION OF SPECIMEN:  N/A  COUNTS:  YES  TOURNIQUET:  * No tourniquets in log *  DICTATION: Patient is 69 year old man with lumbar stenosis and recurrent disc herniation and previous fusion L 3-5 levels with stenosis at  L 23 and retrolisthesisrecurrent disc herniation at L 5 S1 level.  It was elected to take him to surgery for exploration of previous fusion with decompression and fusion from L 2 through S 1 levels.   Procedure: Patient was placed in a prone position on the [place] table after smooth and uncomplicated induction of general endotracheal anesthesia. His low back was prepped and draped in usual sterile fashion with betadine scrub and DuraPrep. Area of incision was infiltrated with local lidocaine. Incision was made to the lumbodorsal fascia was incised and exposure was performed of the L 2 - S 1  spinous processes laminae facet joint and transverse processes. Previous hardware was exposed.  This was removed.  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  The remaining screws had good purchase and these were also removed. There did not appear to be a great deal of bone growth at the L 3 - 4 levels.  Intraoperative x-ray was obtained which confirmed correct orientation with marker probes at L2  Through S 1 levles. A total laminectomy of L 2 through L 3  levels was performed with disarticulation of the facet joints and thorough decompression was performed of both  L 2 , L 3   nerve roots along with the common dural tube. Decompression was performed through scar tissue, which was carefully dissected from the dura. Prior laminectomy defect was defined on the right at L 5 S 1 level and a complete laminectomy was performed at this level.  This involved painstaking dissection through scar tissue as well.  A thorough discectomy was performed at L 5 S1 level with removal of  Fragments of disc material and redo discectomy was performed on the right, as well.  This side had a significant amount of scar tissue.  Decompression was greater than in a standard PLIF procedure with removal of herniated disc material, dissection through scar, and complete decompression of both L 5 and S 1 nerve roots and the thecal sac. A thorough discectomy was then performed on the left with preparation of the  endplates for grafting a trial spacer was placed this level and a thorough discectomy was performed on the right as well at L 5 S 1.  After trial sizing and utilization of sequential shavers, interspaces were packed with autograft, BMP and 15mm  X 23 x 12 degree lordotic PEEK cages with 10 cc of autograft in the intrerspace. The posterolateral region was extensively decorticated and pedicle probes were placed at L 2  And S 1 bilaterally. Intraoperative  fluoroscopy confirmed correct orientationin the AP and lateral plane. 45 x 7.5 mm pedicle screws were placed at L 3, 4, 5  bilaterally and 50 x 6.5 mm screws placed at L 2  bilaterally and 7.5 x 45 mm on the right at S 1 and 7.5 x 50 mm on the left at S 1. Final x-rays demonstrated well-positioned interbody grafts and pedicle screw fixation. A 120 mm lordotic rod was placed on the right and a 120 mm rod was placed on the left locked down in situ and the posterolateral region was packed with remaining BMP, Vitoss and autograft bilaterally. The wound was irrigated. A medium Hemovac drain was placed. Fascia was closed with 1 Vicryl sutures skin edges were reapproximated 2 and 3-0 Vicryl sutures. Long-acting marcaine was injected in the subcutaneous tissues.  The wound was dressed with Dermabond and  an occlusive dressing the patient was extubated in the operating room and taken to recovery in stable satisfactory condition he tolerated traction well counts were correct at the end of the case.   PLAN OF CARE: Admit to inpatient   PATIENT DISPOSITION:  PACU - hemodynamically stable.   Delay start of Pharmacological VTE agent (>24hrs) due to surgical blood loss or risk of bleeding: yes

## 2015-10-24 MED ORDER — PANTOPRAZOLE SODIUM 40 MG PO TBEC
40.0000 mg | DELAYED_RELEASE_TABLET | Freq: Every day | ORAL | Status: DC
  Administered 2015-10-24 – 2015-10-25 (×2): 40 mg via ORAL
  Filled 2015-10-24 (×2): qty 1

## 2015-10-24 NOTE — Progress Notes (Signed)
Subjective: Patient reports "I was doing really good until about three hours ago. I can't seem to stop peeing"  Objective: Vital signs in last 24 hours: Temp:  [97.4 F (36.3 C)-98.6 F (37 C)] 98.2 F (36.8 C) (11/04 1345) Pulse Rate:  [82-100] 100 (11/04 1345) Resp:  [16-20] 18 (11/04 1345) BP: (110-151)/(67-79) 151/76 mmHg (11/04 1345) SpO2:  [91 %-97 %] 95 % (11/04 1345)  Intake/Output from previous day: 11/03 0701 - 11/04 0700 In: 0623 [I.V.:3453; Blood:300] Out: 2520 [Urine:1070; Drains:700; Blood:750] Intake/Output this shift: Total I/O In: 240 [P.O.:240] Out: 175 [Drains:175]  Alert, conversant. Reports only suprapubic discomfort at present. Urinal propped in bed - pt reports voiding with spasms. (?overflow incontinence) Incisional soreness. No leg pain. Hemovac patent ~167ml overnight.  Good strength BLE.   Lab Results: No results for input(s): WBC, HGB, HCT, PLT in the last 72 hours. BMET No results for input(s): NA, K, CL, CO2, GLUCOSE, BUN, CREATININE, CALCIUM in the last 72 hours.  Studies/Results: Dg Lumbar Spine 2-3 Views  10/23/2015  CLINICAL DATA:  Lumbar spine fusion. EXAM: DG C-ARM 61-120 MIN; LUMBAR SPINE - 2-3 VIEW COMPARISON:  None. FINDINGS: Intraoperative AP and lateral views show bilateral pedicle screws in place from levels of L2-S1. Ray cage also seen within the disc space at L5-S1. IMPRESSION: Lumbar spine fusion hardware in place from levels of L2-S1. Electronically Signed   By: Earle Gell M.D.   On: 10/23/2015 12:15   Dg Lumbar Spine 1 View  10/23/2015  CLINICAL DATA:  Redo of L2-3 and L5-S1 decompression and fusion with exploration of L3 through L5. EXAM: LUMBAR SPINE - 1 VIEW is images labeled #1. COMPARISON:  MRI of the lumbar spine of July 28, 2015 and lumbar spine series of Apr 21, 2015. FINDINGS: Metallic localization devices are present projecting a from L2 through S1. The device at L2 overlies the facet joints of L1-2. The device at L3  overlies the anterior superior aspect of the vertebral body. There is no metallic trocar overlying the L4 level. At L5 the upper trocar overlies the mid aspect of the vertebral body. The lower trocar projects 1 cm posterior to the midbody of S1. There surgical sponge material overlying the L5 spinous process. Tissue spreader devices extend from the L2 spinous process to the L5 spinous process. IMPRESSION: Intraoperative localization lateral lumbar spine view as described. Electronically Signed   By: David  Martinique M.D.   On: 10/23/2015 11:24   Dg C-arm 1-60 Min  10/23/2015  CLINICAL DATA:  Lumbar spine fusion. EXAM: DG C-ARM 61-120 MIN; LUMBAR SPINE - 2-3 VIEW COMPARISON:  None. FINDINGS: Intraoperative AP and lateral views show bilateral pedicle screws in place from levels of L2-S1. Ray cage also seen within the disc space at L5-S1. IMPRESSION: Lumbar spine fusion hardware in place from levels of L2-S1. Electronically Signed   By: Earle Gell M.D.   On: 10/23/2015 12:15    Assessment/Plan: Improving   LOS: 1 day  Order rec'd from DrStern for bladder scan. >769ml found. Order rec'd for insertion of Foley catheter, size at nurses discretion; leave x24hrs for bladder rest. Continue to mobilize in LSO. Will leave Hemovac in today.   Verdis Prime 10/24/2015, 2:18 PM

## 2015-10-24 NOTE — Evaluation (Signed)
Occupational Therapy Evaluation and Discharge Summary Patient Details Name: Dustin Keith MRN: [id number] DOB: 12-Nov-1946 Today's Date: 10/24/2015    History of Present Illness Pt is a 69 y/o male admitted s/p PLIF L2-3, L5-S1 on 10/23/15.   Clinical Impression   Pt admitted with the above diagnosis and overall is doing very well with basic adls.  All education complete and pt was instructed where to purchase adaptive equipment to assist him in following back precautions and becoming mod I with all adls.  No further acute OT necessary at this time.    Follow Up Recommendations  No OT follow up;Supervision - Intermittent    Equipment Recommendations  Other (comment) (rec pt purchase AE in gift store.)    Recommendations for Other Services       Precautions / Restrictions Precautions Precautions: Fall;Back Precaution Booklet Issued: Yes (comment) Precaution Comments: Pt educated on 3/3 back precautions.  Recalled 2/3 at end of session. Required Braces or Orthoses: Spinal Brace Spinal Brace: Lumbar corset;Applied in sitting position Restrictions Weight Bearing Restrictions: No      Mobility Bed Mobility Overal bed mobility: Needs Assistance Bed Mobility: Rolling;Sidelying to Sit Rolling: Min guard Sidelying to sit: Min assist       General bed mobility comments: Pt in chair on arrival.  Transfers Overall transfer level: Needs assistance Equipment used: Rolling walker (2 wheeled) Transfers: Sit to/from Stand Sit to Stand: Min guard         General transfer comment: vcs for hand placement.    Balance Overall balance assessment: No apparent balance deficits (not formally assessed)                                          ADL Overall ADL's : Needs assistance/impaired Eating/Feeding: Independent;Sitting   Grooming: Wash/dry hands;Wash/dry face;Oral care;Cueing for compensatory techniques;Standing Grooming Details (indicate cue type and reason):  cuing to use cup to spit into so he does not have to bend over the sink. Upper Body Bathing: Set up;Sitting   Lower Body Bathing: Minimal assistance;Sit to/from stand;Cueing for back precautions Lower Body Bathing Details (indicate cue type and reason): min assist to reach feet w/o long scurb brush. Upper Body Dressing : Set up;Sitting Upper Body Dressing Details (indicate cue type and reason): does well with brace. Lower Body Dressing: Moderate assistance;Adhering to back precautions;Sit to/from stand Lower Body Dressing Details (indicate cue type and reason): cues for back precautions.  Pt introduced to sock and and reacher and told where to find them to purchase. Toilet Transfer: Min guard;Cueing for safety;Ambulation;BSC Toilet Transfer Details (indicate cue type and reason): walked to bathroom with 3:1 over commode. Toileting- Clothing Manipulation and Hygiene: Supervision/safety;Sitting/lateral lean   Tub/ Banker: Supervision/safety;Walk-in shower;Ambulation;Grab bars   Functional mobility during ADLs: Min guard;Rolling walker General ADL Comments: Pt did very well with adls.  Only needs assist with a few LE adls due to back precautions and tight hips.  pt introduced to AE to assist with his independence.     Vision     Perception     Praxis      Pertinent Vitals/Pain Pain Assessment: 0-10 Pain Score: 1  Pain Location: back Pain Descriptors / Indicators: Sore Pain Intervention(s): Monitored during session;Repositioned     Hand Dominance Right   Extremity/Trunk Assessment Upper Extremity Assessment Upper Extremity Assessment: Overall WFL for tasks assessed   Lower Extremity Assessment Lower Extremity  Assessment: Defer to PT evaluation LLE Deficits / Details: LLE weakness appears greater than right, consistent with pre-op symptoms   Cervical / Trunk Assessment Cervical / Trunk Assessment: Normal   Communication Communication Communication: HOH    Cognition Arousal/Alertness: Awake/alert Behavior During Therapy: WFL for tasks assessed/performed Overall Cognitive Status: Within Functional Limits for tasks assessed                     General Comments       Exercises       Shoulder Instructions      Home Living Family/patient expects to be discharged to:: Private residence Living Arrangements: Spouse/significant other Available Help at Discharge: Family;Available 24 hours/day Type of Home: House Home Access: Stairs to enter [employer] of Steps: 4 Entrance Stairs-Rails: Right;Left;Can reach both Home Layout: Two level Alternate Level Stairs-Number of Steps: Flight   Bathroom Shower/Tub: Tub/shower unit;Walk-in shower Shower/tub characteristics: Door Bathroom Toilet: Standard Bathroom Accessibility: Yes How Accessible: Accessible via walker Home Equipment: Walker - 2 wheels;Shower seat - built in;Bedside commode   Additional Comments: pt may need reacher and sock aid.      Prior Functioning/Environment Level of Independence: Needs assistance  Gait / Transfers Assistance Needed: Steadying assist from wife during ambulation. Was not using an AD.  ADL's / Homemaking Assistance Needed: assist with socks and shoes PTA        OT Diagnosis:     OT Problem List:     OT Treatment/Interventions:      OT Goals(Current goals can be found in the care plan section) Acute Rehab OT Goals Patient Stated Goal: Home this weekend OT Goal Formulation: All assessment and education complete, DC therapy  OT Frequency:     Barriers to D/C:            Co-evaluation              End of Session Equipment Utilized During Treatment: Rolling walker;Back brace Nurse Communication: Mobility status  Activity Tolerance: Patient tolerated treatment well Patient left: in chair;with call bell/phone within reach;with chair alarm set   Time: [phone] OT Time Calculation (min): 30 min Charges:  OT General  Charges $OT Visit: 1 Procedure OT Evaluation $Initial OT Evaluation Tier I: 1 Procedure OT Treatments $Self Care/Home Management : 8-22 mins G-Codes:    Glenford Peers 10/26/15, 9:32 AM  [phone]

## 2015-10-24 NOTE — Care Management Note (Signed)
Case Management Note  Patient Details  Name: Dustin Keith MRN: [id number] Date of Birth: December 04, 1946  Subjective/Objective:                    Action/Plan: Patient was admitted for Lumbar two to three, Lumbar five to sacral one redo decompression/ fusion with exploration of lumbar three to five . Lives at home with spouse. Will follow for discharge needs pending PT/OT evals and physician orders.  Expected Discharge Date:                  Expected Discharge Plan:  Home/Self Care  In-House Referral:     Discharge planning Services  CM Consult  Post Acute Care Choice:    Choice offered to:     DME Arranged:    DME Agency:     HH Arranged:    HH Agency:     Status of Service:  In process, will continue to follow  Medicare Important Message Given:    Date Medicare IM Given:    Medicare IM give by:    Date Additional Medicare IM Given:    Additional Medicare Important Message give by:     If discussed at [place] of Stay Meetings, dates discussed:    Additional Comments:  Rolm Baptise, RN 10/24/2015, 10:49 AM

## 2015-10-24 NOTE — Evaluation (Signed)
Physical Therapy Evaluation Patient Details Name: Keyion Knack MRN: [id number] DOB: Apr 21, 1946 Today's Date: 10/24/2015   History of Present Illness  Pt is a 69 y/o male admitted s/p PLIF L2-3, L5-S1 on 10/23/15.  Clinical Impression  Pt admitted with above diagnosis. Pt currently with functional limitations due to the deficits listed below (see PT Problem List). At the time of PT eval pt was able to perform transfers and ambulation at a min guard to supervision level. Overall maintained back precautions well, but could benefit from reinforcement next session. Pt will benefit from skilled PT to increase their independence and safety with mobility to allow discharge to the venue listed below.      Follow Up Recommendations Outpatient PT;Supervision for mobility/OOB (When appropriate per post-op protocol)    Equipment Recommendations  None recommended by PT    Recommendations for Other Services       Precautions / Restrictions Precautions Precautions: Fall;Back Precaution Booklet Issued: Yes (comment) Precaution Comments: Pt was educated on 3/3 back precautions. Was not able to recall at end of session.  Required Braces or Orthoses: Spinal Brace Spinal Brace: Lumbar corset;Applied in sitting position Restrictions Weight Bearing Restrictions: No      Mobility  Bed Mobility Overal bed mobility: Needs Assistance Bed Mobility: Rolling;Sidelying to Sit Rolling: Min guard Sidelying to sit: Min assist       General bed mobility comments: Assist to elevate trunk into full sitting position. VC's for sequencing and technique.   Transfers Overall transfer level: Needs assistance Equipment used: Rolling walker (2 wheeled) Transfers: Sit to/from Stand Sit to Stand: Min assist         General transfer comment: Steadying assist initially as pt powered-up to full stand at EOB. Later in session pt able to perform sit<>stand with supervision for  safety.  Ambulation/Gait Ambulation/Gait assistance: Min guard;Supervision Ambulation Distance (Feet): 400 Feet Assistive device: Rolling walker (2 wheeled) Gait Pattern/deviations: Step-through pattern;Decreased stride length;Trunk flexed Gait velocity: Decreased Gait velocity interpretation: Below normal speed for age/gender General Gait Details: VC's for improved posture and walker placement closer to pt's body. Pt was motivated for distance as he states "it feels so good to be up and walking", but was encouraged to limit this session as it is his first time up. Progressed to supervision level by end of gait training.   Stairs            Wheelchair Mobility    Modified Rankin (Stroke Patients Only)       Balance Overall balance assessment: No apparent balance deficits (not formally assessed)                                           Pertinent Vitals/Pain Pain Assessment: 0-10 Pain Score: 2  Pain Location: Incisional pain Pain Descriptors / Indicators: Operative site guarding Pain Intervention(s): Limited activity within patient's tolerance;Monitored during session;Repositioned    Home Living Family/patient expects to be discharged to:: Private residence Living Arrangements: Spouse/significant other Available Help at Discharge: Family;Available 24 hours/day Type of Home: House Home Access: Stairs to enter Entrance Stairs-Rails: Right;Left;Can reach both Entrance Stairs-Number of Steps: 4 Home Layout: Two level Home Equipment: Walker - 2 wheels;Shower seat - built in      Prior Function Level of Independence: Needs assistance   Gait / Transfers Assistance Needed: Steadying assist from wife during ambulation. Was not using an AD.  Hand Dominance        Extremity/Trunk Assessment   Upper Extremity Assessment: Defer to OT evaluation           Lower Extremity Assessment: Generalized weakness;LLE deficits/detail   LLE  Deficits / Details: LLE weakness appears greater than right, consistent with pre-op symptoms  Cervical / Trunk Assessment: Normal  Communication   Communication: HOH  Cognition Arousal/Alertness: Awake/alert Behavior During Therapy: WFL for tasks assessed/performed Overall Cognitive Status: Within Functional Limits for tasks assessed                      General Comments      Exercises        Assessment/Plan    PT Assessment Patient needs continued PT services  PT Diagnosis Difficulty walking;Acute pain   PT Problem List Decreased strength;Decreased range of motion;Decreased activity tolerance;Decreased balance;Decreased mobility;Decreased knowledge of use of DME;Decreased safety awareness;Decreased knowledge of precautions;Pain  PT Treatment Interventions Gait training;DME instruction;Stair training;Therapeutic activities;Functional mobility training;Therapeutic exercise;Neuromuscular re-education;Patient/family education;Balance training   PT Goals (Current goals can be found in the Care Plan section) Acute Rehab PT Goals Patient Stated Goal: Home this weekend PT Goal Formulation: With patient Time For Goal Achievement: 10/31/15 Potential to Achieve Goals: Good    Frequency Min 5X/week   Barriers to discharge        Co-evaluation               End of Session Equipment Utilized During Treatment: Back brace Activity Tolerance: Patient tolerated treatment well Patient left: in chair;with chair alarm set;with call bell/phone within reach Nurse Communication: Mobility status         Time: 0811-0846 PT Time Calculation (min) (ACUTE ONLY): 35 min   Charges:   PT Evaluation $Initial PT Evaluation Tier I: 1 Procedure PT Treatments $Gait Training: 8-22 mins   PT G Codes:        Rolinda Roan 2015-11-06, 8:57 AM  Rolinda Roan, PT, DPT Acute Rehabilitation Services Pager: [phone]

## 2015-10-24 NOTE — Clinical Social Work Note (Signed)
CSW Consult Acknowledged:   CSW received a consult for SNF placement. Per PT's evaluation the appropriate level of care is outpatient PT. CSW will sign off.        [place], MSW, [place]

## 2015-10-25 MED ORDER — TAMSULOSIN HCL 0.4 MG PO CAPS
0.4000 mg | ORAL_CAPSULE | Freq: Every day | ORAL | Status: DC
  Administered 2015-10-25 – 2015-10-26 (×2): 0.4 mg via ORAL
  Filled 2015-10-25 (×2): qty 1

## 2015-10-25 NOTE — Progress Notes (Signed)
Floey cath d/c'd at this time per order.

## 2015-10-25 NOTE — Progress Notes (Signed)
Physical Therapy Treatment Patient Details Name: Dustin Keith MRN: [id number] DOB: January 11, 1946 Today's Date: 10/25/2015    History of Present Illness Pt is a 69 y/o male admitted s/p PLIF L2-3, L5-S1 on 10/23/15.    PT Comments    Pt needing increased assistance today with mobility, reporting dizziness and feeling more pain/stiffness than with session yesterday. Pt encouraged to try walking again with nursing later today as long as he was not dizzy to assist with decreased stiffness. Pt also advised to change his position both in bed and the chair every 45 minutes to 1 hour to assist with decreased stiffness.   Follow Up Recommendations  Outpatient PT;Supervision for mobility/OOB (when appropriate per post op protocol)     Equipment Recommendations  None recommended by PT    Precautions / Restrictions Precautions Precautions: Fall;Back Precaution Comments: pt able to recall 3/3 back precautions.  Required Braces or Orthoses: Spinal Brace Spinal Brace: Lumbar corset;Applied in sitting position (pt needed supervision with cues)    Mobility  Bed Mobility Overal bed mobility: Needs Assistance   Rolling: Min assist Sidelying to sit: Min assist       General bed mobility comments: min assist to roll and then go from sidelying to sitting on edge of bed with bed flat and no rails used. multimodal cues provided on sequencing and technique.  Transfers Overall transfer level: Needs assistance Equipment used: Rolling walker (2 wheeled) Transfers: Sit to/from Stand Sit to Stand: Min assist         General transfer comment: min assist to power up into standing from the bed, with cues on hand placement and anterior weight shifting to assist with standing.  Ambulation/Gait Ambulation/Gait assistance: Min assist Ambulation Distance (Feet): 410 Feet Assistive device: Rolling walker (2 wheeled) Gait Pattern/deviations: Step-through pattern;Decreased stride length;Narrow base of  support;Trunk flexed Gait velocity: Decreased Gait velocity interpretation: Below normal speed for age/gender General Gait Details: cues to correct gait deviaitons and posture. Pt with 2 standing rest breaks due to dizziness/ "feeling funny". spouse retrieved chair after 1st episode and on 2cd episode PTA had pt sit down for safety as pt was now needing increased assistance for balance.                                          Cognition Arousal/Alertness: Awake/alert Behavior During Therapy: WFL for tasks assessed/performed Overall Cognitive Status: Within Functional Limits for tasks assessed             PT Goals (current goals can now be found in the care plan section) Acute Rehab PT Goals Patient Stated Goal: Home this weekend PT Goal Formulation: With patient Time For Goal Achievement: 10/31/15 Potential to Achieve Goals: Good Progress towards PT goals: Progressing toward goals    Frequency  Min 5X/week    PT Plan Current plan remains appropriate    End of Session Equipment Utilized During Treatment: Gait belt;Back brace Activity Tolerance: Patient tolerated treatment well Patient left: in chair;with nursing/sitter in room;with family/visitor present;with call bell/phone within reach     Time: 1153-1216 PT Time Calculation (min) (ACUTE ONLY): 23 min  Charges:  $Gait Training: 8-22 mins $Therapeutic Activity: 8-22 mins            Willow Ora 10/25/2015, 10:21 PM   Willow Ora, PTA, CLT Acute Rehab Services [phone] 10/25/15, 10:24 PM

## 2015-10-25 NOTE — Progress Notes (Signed)
Patient ID: Dustin Keith, male   DOB: 1946-08-25, 69 y.o.   MRN: [id number] Afeb, vss No new neuro issues Was ready for d/c but developed some urinary retention regarding foley catheter. Will d/c foley today and hopefully discharge tomorrow if he can urinate.

## 2015-10-26 MED ORDER — OXYCODONE-ACETAMINOPHEN 5-325 MG PO TABS: 1.0000 | ORAL_TABLET | ORAL | Status: DC | PRN

## 2015-10-26 MED ORDER — METHOCARBAMOL 500 MG PO TABS: 500.0000 mg | ORAL_TABLET | Freq: Four times a day (QID) | ORAL | Status: DC | PRN

## 2015-10-26 NOTE — Discharge Summary (Signed)
Date of admission: 10/23/2015  Date of discharge: 10/26/2015  PRE-OPERATIVE DIAGNOSIS: Herniated lumbar disc, lumbar spinal stenosis, Lumbar radiculopathy, Lumbago L23 and L 5 S1 levels  POST-OPERATIVE DIAGNOSIS: Herniated lumbar disc, lumbar spinal stenosis, Lumbar radiculopathy, Lumbago L23 and L 5 S1 levels  PROCEDURE: Procedure(s) with comments: Lumbar two to three, Lumbar five to sacral one redo decompression/ fusion with exploration of lumbar three to five (N/A) - L2-3 L5-S1 Redo Decompression/Fusion with exploration of L3-5   Decompression in excess of standard PLIF procedure  Pedicle screw fixation L 2-S1 levels  Posterolateral arthrodesis L 2 - S 1 levels  Attending physician: Erline Levine, M.D.  Hospital course: The patient was taken to the operating room for the above listed operation. He tolerated this well. His postoperative course was complicated by urinary retention. He had a Foley catheter replaced for 24 hours. When it was removed she was able to void independently. He is discharged home at this time in stable medical neurological condition.  Follow-up: Dr. Vertell Limber  Medications: Resume home medications, Percocet for pain, Robaxin for muscle spasms

## 2015-10-26 NOTE — Progress Notes (Signed)
Pt for discharge home today. Discharge orders received. IV dcd with dressing clean dry and intact to lower back. Discharge instructions and prescriptions (1) given with verbalized understanding. Family at bedside to assist with discharge. Staff brought patient to lobby via wheelchair at 1300 Transported to home by spouse.

## 2015-10-26 NOTE — Progress Notes (Signed)
No acute events AVSS Awake and alert Full strength in bilateral lower extremities Incision is clean, dry, intact Stable Urinary retention resolved DC drain DC home

## 2016-11-05 ENCOUNTER — Ambulatory Visit: Payer: BLUE CROSS/BLUE SHIELD | Attending: Physician Assistant | Admitting: Physical Therapy

## 2016-11-05 DIAGNOSIS — M25552 Pain in left hip: Secondary | ICD-10-CM | POA: Diagnosis present

## 2016-11-05 NOTE — Therapy (Addendum)
[facility] [address]  [place], [place], [address] Phone: [phone]   Fax:  [phone]  Physical Therapy Evaluation  Patient Details  Name: Dustin Keith MRN: [id number] Date of Birth: 12-20-1946 Referring Provider: Krystal Eaton  Encounter Date: 11/05/2016      PT End of Session - 11/05/16 1117    Visit Number 1   Number of Visits 8   Date for PT Re-Evaluation 12/17/16  extended date due to holiday and scheduling   PT Start Time 0924   PT Stop Time 1012   PT Time Calculation (min) 48 min   Activity Tolerance Patient tolerated treatment well   Behavior During Therapy [facility] for tasks assessed/performed      Past Medical History:  Diagnosis Date  . Arthritis   . Cancer (HCC)    DUODENAL   . Hypertension   . Hypothyroidism   . Sleep apnea    CPAP     Past Surgical History:  Procedure Laterality Date  . DUODENAL DIVERTICULECTOMY     02/2015   [facility]  DR EVANS  . KNEE ARTHROSCOPY     RIGHT  . SHOULDER SURGERY     RIGHT    There were no vitals filed for this visit.       Subjective Assessment - 11/05/16 0924    Subjective Patient reporting bursitis of L hip. 1 year ago - back surgery - still does exercises for back. "I just need ot be careful of what I lift and how I lift it." Hip pain has been present for > 6 months, initailly thought it was related to back. Has greatest difficulty with walking. Unable to sleep on L hip due to pain. Responsible for all housekeeping and yard work.    Limitations Walking;Standing   How long can you stand comfortably? 40 minutes   How long can you walk comfortably? 30 minutes - very slow    Diagnostic tests Xray - L hip - no arthritis    Patient Stated Goals reduce pain, improve function   Currently in Pain? Yes   Pain Score 6    Pain Location Hip   Pain Orientation Left   Pain Descriptors / Indicators Aching   Pain Type Chronic pain   Pain Radiating  Towards intermittent to L knee   Pain Onset More than a month ago   Pain Frequency Constant   Aggravating Factors  walking, standing, sleeping on L side   Pain Relieving Factors resting, pain cream             [facility] PT Assessment - 11/05/16 0001      Assessment   Medical Diagnosis Trochanteric bursitis of L hip, L hip tendonitis   Referring Provider Judith Part Chabon   Onset Date/Surgical Date --  6 months ago   Next MD Visit --  11/18/16   Prior Therapy yes - for back     Precautions   Precautions Back   Precaution Comments prior surgery - lifting no greater than 25#     Balance Screen   Has the patient fallen in the past 6 months No   Has the patient had a decrease in activity level because of a fear of falling?  No   Is the patient reluctant to leave their home because of a fear of falling?  No     Home Ecologist residence   Living Arrangements Spouse/significant other  Type of [place] to enter   Entrance Stairs-Number of Steps 3   Home Layout Two level   Alternate Level Stairs-Number of Steps 14     Prior Function   Level of Independence Independent   Vocation Retired   [employer] house/yard work   Leisure walking - outdoors     Cognition   Overall Cognitive Status Within Functional Limits for tasks assessed     Observation/Other Assessments   Focus on Therapeutic Outcomes (FOTO)  47 (53% limited, predicted 39% limited)     ROM / Strength   AROM / PROM / Strength AROM;Strength     Strength   Strength Assessment Site Hip;Knee;Ankle   Right/Left Hip Right;Left   Right Hip Flexion 5/5   Right Hip Extension 4+/5   Right Hip ABduction 4/5   Right Hip ADduction 4/5   Left Hip Flexion 4/5   Left Hip Extension 4/5   Left Hip ABduction 3/5  limited by pain   Left Hip ADduction 4/5   Right/Left Knee Right;Left   Right Knee Flexion 5/5   Right Knee Extension 5/5   Left Knee Flexion 4/5   limited by pain   Left Knee Extension 5/5     Flexibility   Soft Tissue Assessment /Muscle Length yes   Hamstrings B tightness   ITB B tightness   Piriformis B tightness     Palpation   Palpation comment tender to light and deep palpation over greater trochanter           Iontophoresis of L hip; Dexamethasone 1.0 mL - 79mA - 6 hours - patch #1                PT Education - 11/05/16 1116    Education provided Yes   Education Details exam findings, POC, HEP, iontophoresis precautions and contraindications   Person(s) Educated Patient   Methods Explanation;Demonstration;Handout   Comprehension Verbalized understanding;Returned demonstration             PT Long Term Goals - 11/05/16 1146      PT LONG TERM GOAL #1   Title patient to be independent with HEP (12/17/16)   Status New     PT LONG TERM GOAL #2   Title Patient to demonstrate or verbalize ability to ambulate greater than >1 hour with pain no greater than 1/10 (12/17/16)   Status New     PT LONG TERM GOAL #3   Title Patient to report daily pain no greater than 1/10 to allow return to daily activities and exercise (12/17/16)   Status New     PT LONG TERM GOAL #4   Title Patient to improve L hip abduction strength to >/= 4/5 with no pain (12/17/16)   Status New               Plan - 11/05/16 1119    Clinical Impression Statement Patient is a 70 y/o M presenting to OPPT today with primary complaints of L hip pain exacerbated with prolonged standing and walking. Patient with signs and symptoms consistent with trochanteric bursitis of L hip. Patient today demonstrating pain with palpation to L greater trochanter, redeuced L hip strength limited due to pain, very tight B hamstring and TFL musculature. Patient to benefit from skilled PT intervention to address the above listed deficits to allow patient to return to daily activities and exericse with reduced pain. Ionto initiated today, will  continue to assess patient benefit from  this service.    Rehab Potential Good   PT Frequency 2x / week   PT Duration 4 weeks   PT Treatment/Interventions ADLs/Self Care Home Management;Cryotherapy;Electrical Stimulation;Iontophoresis 4mg /ml Dexamethasone;Moist Heat;Ultrasound;Therapeutic exercise;Therapeutic activities;Patient/family education;Manual techniques;Passive range of motion;Vasopneumatic Device;Taping;Dry needling   PT Next Visit Plan progress hip flexibility, pain control   Consulted and Agree with Plan of Care Patient      Patient will benefit from skilled therapeutic intervention in order to improve the following deficits and impairments:  Decreased activity tolerance, Impaired flexibility, Difficulty walking, Pain, Decreased strength  Visit Diagnosis: Pain in left hip - Plan: PT plan of care cert/re-cert      G-Codes - 0000000 [id number]    Functional Assessment Tool Used FOTO: 47 (53% limited)   Functional Limitation Mobility: Walking and moving around   Mobility: Walking and Moving Around Current Status [id number]) At least 40 percent but less than 60 percent impaired, limited or restricted   Mobility: Walking and Moving Around Goal Status [phone]) At least 20 percent but less than 40 percent impaired, limited or restricted       Problem List Patient Active Problem List   Diagnosis Date Noted  . Herniated lumbar intervertebral disc 10/23/2015      Lanney Gins, PT, DPT 11/05/16 11:55 AM     [facility] [address]  [place], [place], [address] Phone: [phone]   Fax:  [phone]  Name: Bertram Gipe MRN: [id number] Date of Birth: 02/19/1946

## 2016-11-05 NOTE — Patient Instructions (Signed)
Outer Hip Stretch: Reclined IT Band Stretch (Strap)    Strap around opposite foot, pull across only as far as possible with shoulders on mat. Hold 30 seconds - 3 times     Hamstring Step 2    Left foot relaxed, knee straight, other leg bent, foot flat. Raise straight leg further upward to maximal range. Hold __30_ seconds. Relax leg completely down. Repeat _3__ times.    Piriformis Stretch - Supine    Pull uninvolved knee across body toward opposite shoulder. Hold slight stretch for _30__ seconds. Repeat with involved leg. Repeat _3__ times.     IONTOPHORESIS PATIENT PRECAUTIONS & CONTRAINDICATIONS:  . Redness under one or both electrodes can occur.  This characterized by a uniform redness that usually disappears within 12 hours of treatment. . Small pinhead size blisters may result in response to the drug.  Contact your physician if the problem persists more than 24 hours. . On rare occasions, iontophoresis therapy can result in temporary skin reactions such as rash, inflammation, irritation or burns.  The skin reactions may be the result of individual sensitivity to the ionic solution used, the condition of the skin at the start of treatment, reaction to the materials in the electrodes, allergies or sensitivity to dexamethasone, or a poor connection between the patch and your skin.  Discontinue using iontophoresis if you have any of these reactions and report to your therapist. . Remove the Patch or electrodes if you have any undue sensation of pain or burning during the treatment and report discomfort to your therapist. . Tell your Therapist if you have had known adverse reactions to the application of electrical current. . If using the Patch, the LED light will turn off when treatment is complete and the patch can be removed.  Approximate treatment time is 1-3 hours.  Remove the patch when light goes off or after 6 hours. . The Patch can be worn during normal activity,  however excessive motion where the electrodes have been placed can cause poor contact between the skin and the electrode or uneven electrical current resulting in greater risk of skin irritation. Marland Kitchen Keep out of the reach of children.   . DO NOT use if you have a cardiac pacemaker or any other electrically sensitive implanted device. . DO NOT use if you have a known sensitivity to dexamethasone. . DO NOT use during Magnetic Resonance Imaging (MRI). . DO NOT use over broken or compromised skin (e.g. sunburn, cuts, or acne) due to the increased risk of skin reaction. . DO NOT SHAVE over the area to be treated:  To establish good contact between the Patch and the skin, excessive hair may be clipped. . DO NOT place the Patch or electrodes on or over your eyes, directly over your heart, or brain. . DO NOT reuse the Patch or electrodes as this may cause burns to occur.

## 2016-11-09 ENCOUNTER — Ambulatory Visit: Payer: BLUE CROSS/BLUE SHIELD

## 2016-11-09 DIAGNOSIS — M25552 Pain in left hip: Secondary | ICD-10-CM

## 2016-11-09 NOTE — Therapy (Signed)
[facility] [address]  [place], [place], [address] Phone: [phone]   Fax:  [phone]  Physical Therapy Treatment  Patient Details  Name: Dustin Keith MRN: [id number] Date of Birth: 31-Jan-1946 Referring Provider: Krystal Eaton  Encounter Date: 11/09/2016      PT End of Session - 11/09/16 1110    Visit Number 2   Number of Visits 8   Date for PT Re-Evaluation 12/17/16  extended date due to holiday and scheduling   PT Start Time 1106   PT Stop Time 1150   PT Time Calculation (min) 44 min   Activity Tolerance Patient tolerated treatment well   Behavior During Therapy [facility] for tasks assessed/performed      Past Medical History:  Diagnosis Date  . Arthritis   . Cancer (HCC)    DUODENAL   . Hypertension   . Hypothyroidism   . Sleep apnea    CPAP     Past Surgical History:  Procedure Laterality Date  . DUODENAL DIVERTICULECTOMY     02/2015   [facility]  DR EVANS  . KNEE ARTHROSCOPY     RIGHT  . SHOULDER SURGERY     RIGHT    There were no vitals filed for this visit.      Subjective Assessment - 11/09/16 1108    Subjective Pt. reporting his L hip pain has worsened this morning.  However no radiating symptoms today.     Patient Stated Goals reduce pain, improve function   Currently in Pain? Yes   Pain Score 7    Pain Location Hip   Pain Orientation Left   Pain Descriptors / Indicators Aching   Pain Type Chronic pain   Pain Radiating Towards intermittent   Pain Onset More than a month ago   Multiple Pain Sites No              [facility] Adult PT Treatment/Exercise - 11/09/16 1158      Knee/Hip Exercises: Stretches   Passive Hamstring Stretch 3 reps;60 seconds   ITB Stretch 2 reps;60 seconds   Piriformis Stretch 2 reps;60 seconds     Knee/Hip Exercises: Aerobic   Nustep Lvl 6, 5 min      Knee/Hip Exercises: Sidelying   Clams 1 set; AROM; 15 reps     Iontophoresis   Type of  Iontophoresis Dexamethasone   Location L hip   Dose 1.0 mL   Time 42mA - 6 hours  patch #2/6     Manual Therapy   Manual Therapy Myofascial release   Manual therapy comments R sidelying L piriformis TPR with red med ball (1000gr)             PT Long Term Goals - 11/09/16 1111      PT LONG TERM GOAL #1   Title patient to be independent with HEP (12/17/16)   Status On-going     PT LONG TERM GOAL #2   Title Patient to demonstrate or verbalize ability to ambulate greater than >1 hour with pain no greater than 1/10 (12/17/16)   Status On-going     PT LONG TERM GOAL #3   Title Patient to report daily pain no greater than 1/10 to allow return to daily activities and exercise (12/17/16)   Status On-going     PT LONG TERM GOAL #4   Title Patient to improve L hip abduction strength to >/= 4/5 with no pain (12/17/16)   Status On-going  Plan - 11/09/16 1111    Clinical Impression Statement Pt. with 7/10 L hip pain initially today reporting, "this has been a bad day for the hip".  Pt. with cramping with LE stretching initially today however cramping subsided with prolonged stretching to HS, piriformis, and ITB.  Today's treatment focused on LE stretching and TPR to piriformis to tolerance.  Pt. reporting benefit from ionto patch #1/6 applied last treatment to L hip thus ionto patch #2/6 applied today.  Pt. with good pain relief today following LE stretching ending treatment with 2/10 L hip pain.     PT Treatment/Interventions ADLs/Self Care Home Management;Cryotherapy;Electrical Stimulation;Iontophoresis 4mg /ml Dexamethasone;Moist Heat;Ultrasound;Therapeutic exercise;Therapeutic activities;Patient/family education;Manual techniques;Passive range of motion;Vasopneumatic Device;Taping;Dry needling   PT Next Visit Plan progress hip flexibility, pain control; hip strengthening per tolerance       Patient will benefit from skilled therapeutic intervention in order to  improve the following deficits and impairments:  Decreased activity tolerance, Impaired flexibility, Difficulty walking, Pain, Decreased strength  Visit Diagnosis: Pain in left hip     Problem List Patient Active Problem List   Diagnosis Date Noted  . Herniated lumbar intervertebral disc 10/23/2015    Bess Harvest, PTA 11/09/16 12:50 PM   [facility] [address]  [place], [place], [address] Phone: [phone]   Fax:  [phone]  Name: Dustin Keith MRN: [id number] Date of Birth: [birth year]

## 2016-11-15 ENCOUNTER — Ambulatory Visit: Payer: BLUE CROSS/BLUE SHIELD

## 2016-11-15 DIAGNOSIS — M25552 Pain in left hip: Secondary | ICD-10-CM | POA: Diagnosis not present

## 2016-11-15 NOTE — Therapy (Signed)
[facility] [address]  [place], [place], [address] Phone: [phone]   Fax:  [phone]  Physical Therapy Treatment  Patient Details  Name: Dustin Keith MRN: [id number] Date of Birth: [birth year] Referring Provider: Krystal Eaton  Encounter Date: 11/15/2016      PT End of Session - 11/15/16 1332    Visit Number 3   Number of Visits 8   Date for PT Re-Evaluation 12/17/16  extended date due to holiday and scheduling   PT Start Time 1316   PT Stop Time 1356   PT Time Calculation (min) 40 min   Activity Tolerance Patient tolerated treatment well   Behavior During Therapy [facility] for tasks assessed/performed      Past Medical History:  Diagnosis Date  . Arthritis   . Cancer (HCC)    DUODENAL   . Hypertension   . Hypothyroidism   . Sleep apnea    CPAP     Past Surgical History:  Procedure Laterality Date  . DUODENAL DIVERTICULECTOMY     02/2015   [facility]  DR EVANS  . KNEE ARTHROSCOPY     RIGHT  . SHOULDER SURGERY     RIGHT    There were no vitals filed for this visit.      Subjective Assessment - 11/15/16 1313    Subjective Pt. reporting relief from ionto patch to L hip.     Patient Stated Goals reduce pain, improve function   Currently in Pain? No/denies   Pain Score 0-No pain   Multiple Pain Sites No            [facility] Adult PT Treatment/Exercise - 11/15/16 1334      Knee/Hip Exercises: Stretches   Passive Hamstring Stretch 3 reps;60 seconds   ITB Stretch 2 reps;60 seconds   Piriformis Stretch 2 reps;60 seconds   Other Knee/Hip Stretches L groin/adductor stretch x 1 min (in response to cramping x 2 with hip abduction AROM     Knee/Hip Exercises: Aerobic   Nustep Lvl 6, 8 min      Knee/Hip Exercises: Sidelying   Hip ADduction 10 reps;2 sets  min assist provided from therapist    Clams 2 set; AROM; 10 reps  with green TB      Modalities   Modalities Moist Heat     Moist Heat  Therapy   Number Minutes Moist Heat 10 Minutes   Moist Heat Location Hip  R sidelying with LLE on bolster     Iontophoresis   Type of Iontophoresis Dexamethasone   Location L hip   Dose 1.0 mL   Time 74mA - 6 hours  patch #3/6                PT Education - 11/15/16 1413    Education provided Yes   Education Details clam shell with green TB, R sidelying L hip abduction   Person(s) Educated Patient   Methods Explanation;Demonstration;Handout   Comprehension Verbalized understanding;Returned demonstration;Need further instruction;Verbal cues required             PT Long Term Goals - 11/09/16 1111      PT LONG TERM GOAL #1   Title patient to be independent with HEP (12/17/16)   Status On-going     PT LONG TERM GOAL #2   Title Patient to demonstrate or verbalize ability to ambulate greater than >1 hour with pain no greater than 1/10 (12/17/16)   Status On-going  PT LONG TERM GOAL #3   Title Patient to report daily pain no greater than 1/10 to allow return to daily activities and exercise (12/17/16)   Status On-going     PT LONG TERM GOAL #4   Title Patient to improve L hip abduction strength to >/= 4/5 with no pain (12/17/16)   Status On-going               Plan - 11/15/16 1319    Clinical Impression Statement Pt. reporting L hip pain has been less over weekend since application of ionto patch last treatment.  Pt. with better tolerance for TPR/DTM to piriformis area today however with tenderness to palpation throughout glute.  Significant time spent today with LE stretching with good pt. response following this.  Pt. progressed reps and sets with clam shell and hip abduction today however reported L adductor tightness/cramp thus time spent with adductor stretching.  Pt. with good response to moist heat application to L hip today able to end treatment without pain.  Conservative clam shell and sidelying hip abduction HEP activities issued to pt. today via  handout with green TB.  Will plan to monitor response to HEP addition next treatment.  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   PT Treatment/Interventions ADLs/Self Care Home Management;Cryotherapy;Electrical Stimulation;Iontophoresis 4mg /ml Dexamethasone;Moist Heat;Ultrasound;Therapeutic exercise;Therapeutic activities;Patient/family education;Manual techniques;Passive range of motion;Vasopneumatic Device;Taping;Dry needling   PT Next Visit Plan Progress hip flexibility, pain control; hip strengthening per tolerance       Patient will benefit from skilled therapeutic intervention in order to improve the following deficits and impairments:  Decreased activity tolerance, Impaired flexibility, Difficulty walking, Pain, Decreased strength  Visit Diagnosis: Pain in left hip     Problem List Patient Active Problem List   Diagnosis Date Noted  . Herniated lumbar intervertebral disc 10/23/2015    Bess Harvest, PTA 11/15/16 2:31 PM  [facility] [address]  [place], [place], [address] Phone: [phone]   Fax:  [phone]  Name: Dustin Keith MRN: [id number] Date of Birth: March 21, 1946

## 2016-11-17 ENCOUNTER — Ambulatory Visit: Payer: BLUE CROSS/BLUE SHIELD | Admitting: Physical Therapy

## 2016-11-17 DIAGNOSIS — M25552 Pain in left hip: Secondary | ICD-10-CM | POA: Diagnosis not present

## 2016-11-17 NOTE — Therapy (Signed)
[facility] [address]  [place], [place], [address] Phone: [phone]   Fax:  [phone]  Physical Therapy Treatment  Patient Details  Name: Dustin Keith MRN: [id number] Date of Birth: 02-15-1946 Referring Provider: Krystal Eaton  Encounter Date: 11/17/2016      PT End of Session - 11/17/16 1333    Visit Number 4   Number of Visits 8   Date for PT Re-Evaluation 12/17/16   PT Start Time 0850   PT Stop Time 0930   PT Time Calculation (min) 40 min   Activity Tolerance Patient tolerated treatment well   Behavior During Therapy [facility] for tasks assessed/performed      Past Medical History:  Diagnosis Date  . Arthritis   . Cancer (HCC)    DUODENAL   . Hypertension   . Hypothyroidism   . Sleep apnea    CPAP     Past Surgical History:  Procedure Laterality Date  . DUODENAL DIVERTICULECTOMY     02/2015   [facility]  DR EVANS  . KNEE ARTHROSCOPY     RIGHT  . SHOULDER SURGERY     RIGHT    There were no vitals filed for this visit.      Subjective Assessment - 11/17/16 0852    Subjective Patient very busy with sick wife - hip is feeling a little better   Patient Stated Goals reduce pain, improve function   Currently in Pain? Yes   Pain Score 1    Pain Location Hip   Pain Orientation Left   Pain Descriptors / Indicators Aching   Pain Type Chronic pain   Pain Onset More than a month ago   Pain Frequency Constant   Aggravating Factors  walking, standing, sleeping on L side   Pain Relieving Factors resting, pain cream                         [facility] Adult PT Treatment/Exercise - 11/17/16 0853      Exercises   Exercises Knee/Hip     Knee/Hip Exercises: Stretches   Passive Hamstring Stretch Left;2 reps;30 seconds   ITB Stretch Both;2 reps;60 seconds     Knee/Hip Exercises: Aerobic   Nustep Level 6 x 8 minutes     Knee/Hip Exercises: Standing   Other Standing Knee Exercises  side stepping x 30 feet (R and L) with red tband at ankles     Knee/Hip Exercises: Supine   Bridges Limitations attmepted - unable due to back pain   Other Supine Knee/Hip Exercises unilateral hip abduction with green tband at knees x 15 each     Knee/Hip Exercises: Sidelying   Hip ABduction Left;2 sets;10 reps   Hip ADduction --   Clams 2 x 15 with green tband at knees  some cramping     Manual Therapy   Manual Therapy Myofascial release   Manual therapy comments R sidelying L piriformis TPR with green med ball                     PT Long Term Goals - 11/09/16 1111      PT LONG TERM GOAL #1   Title patient to be independent with HEP (12/17/16)   Status On-going     PT LONG TERM GOAL #2   Title Patient to demonstrate or verbalize ability to ambulate greater than >1 hour with pain no greater than 1/10 (12/17/16)  Status On-going     PT LONG TERM GOAL #3   Title Patient to report daily pain no greater than 1/10 to allow return to daily activities and exercise (12/17/16)   Status On-going     PT LONG TERM GOAL #4   Title Patient to improve L hip abduction strength to >/= 4/5 with no pain (12/17/16)   Status On-going               Plan - 11/17/16 1333    Clinical Impression Statement Patient today with reports of ionto patch "not working for very long" after last treatment session. Patient not wishing to apply ionto today. Patient with continued bilateral hip weakness and tightness with education provided today on proper holds for stretches (30 seconds or greater) as patient report she was only hold stretch for a few seconds. Patient to continue to benefit form PT to maximize function and mobility.    PT Treatment/Interventions ADLs/Self Care Home Management;Cryotherapy;Electrical Stimulation;Iontophoresis 4mg /ml Dexamethasone;Moist Heat;Ultrasound;Therapeutic exercise;Therapeutic activities;Patient/family education;Manual techniques;Passive range of  motion;Vasopneumatic Device;Taping;Dry needling   PT Next Visit Plan Progress hip flexibility, pain control; hip strengthening per tolerance    Consulted and Agree with Plan of Care Patient      Patient will benefit from skilled therapeutic intervention in order to improve the following deficits and impairments:  Decreased activity tolerance, Impaired flexibility, Difficulty walking, Pain, Decreased strength  Visit Diagnosis: Pain in left hip     Problem List Patient Active Problem List   Diagnosis Date Noted  . Herniated lumbar intervertebral disc 10/23/2015      Lanney Gins, PT, DPT 11/17/16 1:37 PM     [facility] [address]  [place], [place], [address] Phone: [phone]   Fax:  [phone]  Name: Dustin Keith MRN: [id number] Date of Birth: March 01, 1946

## 2016-11-22 ENCOUNTER — Ambulatory Visit: Payer: BLUE CROSS/BLUE SHIELD | Attending: Physician Assistant

## 2016-11-22 DIAGNOSIS — M25552 Pain in left hip: Secondary | ICD-10-CM | POA: Diagnosis present

## 2016-11-22 NOTE — Therapy (Signed)
[facility] [address]  [place], [place], [address] Phone: [phone]   Fax:  [phone]  Physical Therapy Treatment  Patient Details  Name: Dustin Keith MRN: [id number] Date of Birth: 12-15-1946 Referring Provider: Krystal Eaton  Encounter Date: 11/22/2016      PT End of Session - 11/22/16 1502    Visit Number 5   Number of Visits 8   Date for PT Re-Evaluation 12/17/16   PT Start Time [id number]   PT Stop Time 1528   PT Time Calculation (min) 40 min   Activity Tolerance Patient tolerated treatment well   Behavior During Therapy [facility] for tasks assessed/performed      Past Medical History:  Diagnosis Date  . Arthritis   . Cancer (HCC)    DUODENAL   . Hypertension   . Hypothyroidism   . Sleep apnea    CPAP     Past Surgical History:  Procedure Laterality Date  . DUODENAL DIVERTICULECTOMY     02/2015   [facility]  DR EVANS  . KNEE ARTHROSCOPY     RIGHT  . SHOULDER SURGERY     RIGHT    There were no vitals filed for this visit.      Subjective Assessment - 11/22/16 1451    Subjective Pt. reporting receiving cortisone shot from MD last week (11/30) and has been feeling better since that point.     Patient Stated Goals reduce pain, improve function   Currently in Pain? No/denies   Pain Score 0-No pain   Pain Location --   Pain Orientation --   Pain Descriptors / Indicators --   Pain Type --   Pain Onset --   Pain Frequency --   Multiple Pain Sites No             [facility] Adult PT Treatment/Exercise - 11/22/16 1504      Knee/Hip Exercises: Stretches   Passive Hamstring Stretch Left;2 reps;30 seconds  2nd set with strap   ITB Stretch Both;2 reps;60 seconds  2nd set with strap   Piriformis Stretch 2 reps;60 seconds     Knee/Hip Exercises: Standing   Other Standing Knee Exercises side stepping next to counter x 2 laps down/back feet (R and L) with red tband at ankles   Other Standing Knee  Exercises Monster walk with red TB  forward/backward x lap   Pt. reporting muscular fatigue with this     Knee/Hip Exercises: Supine   Bridges Limitations Hooklying bridge with abduction green TB x 10 reps    Other Supine Knee/Hip Exercises Hooklying bridge with alternating hip abduction with green TB  2 x 5 reps each side      Knee/Hip Exercises: Sidelying   Clams x 15 reps with green tband at knees                PT Education - 11/22/16 1536    Education provided Yes   Education Details green looped TB issued to pt. for clam shell    Person(s) Educated Patient   Methods Explanation;Demonstration;Handout   Comprehension Verbalized understanding;Returned demonstration             PT Long Term Goals - 11/09/16 1111      PT LONG TERM GOAL #1   Title patient to be independent with HEP (12/17/16)   Status On-going     PT LONG TERM GOAL #2   Title Patient to demonstrate or verbalize ability to  ambulate greater than >1 hour with pain no greater than 1/10 (12/17/16)   Status On-going     PT LONG TERM GOAL #3   Title Patient to report daily pain no greater than 1/10 to allow return to daily activities and exercise (12/17/16)   Status On-going     PT LONG TERM GOAL #4   Title Patient to improve L hip abduction strength to >/= 4/5 with no pain (12/17/16)   Status On-going               Plan - 11/22/16 1502    Clinical Impression Statement Pt. reporting he has felt much better since receiving cortisone injection on 11/30.  Pt. reporting only pain at this point is dull soreness at site of injection.  Pt. feeling like he is doing much better and reports he feels he responded well to standing activity performed last treatment.  Today's treatment focused on progression of lumbopelvic strengthening activity in supine and continued standing activities.  Pt. seemed to respond well to side stepping and monster walk with red TB today.  Green TB issued to pt. for clam shell  activity at home.     PT Treatment/Interventions ADLs/Self Care Home Management;Cryotherapy;Electrical Stimulation;Iontophoresis 4mg /ml Dexamethasone;Moist Heat;Ultrasound;Therapeutic exercise;Therapeutic activities;Patient/family education;Manual techniques;Passive range of motion;Vasopneumatic Device;Taping;Dry needling   PT Next Visit Plan Progress hip flexibility, pain control; hip strengthening per tolerance       Patient will benefit from skilled therapeutic intervention in order to improve the following deficits and impairments:  Decreased activity tolerance, Impaired flexibility, Difficulty walking, Pain, Decreased strength  Visit Diagnosis: Pain in left hip     Problem List Patient Active Problem List   Diagnosis Date Noted  . Herniated lumbar intervertebral disc 10/23/2015    Bess Harvest, PTA 11/22/16 3:43 PM  [facility] [address]  [place], [place], [address] Phone: [phone]   Fax:  [phone]  Name: Dustin Keith MRN: [id number] Date of Birth: 1946/12/16

## 2016-11-24 ENCOUNTER — Ambulatory Visit: Payer: BLUE CROSS/BLUE SHIELD | Admitting: Physical Therapy

## 2016-11-29 ENCOUNTER — Ambulatory Visit: Payer: BLUE CROSS/BLUE SHIELD

## 2016-11-29 DIAGNOSIS — M25552 Pain in left hip: Secondary | ICD-10-CM

## 2016-11-29 NOTE — Therapy (Signed)
[facility] [address]  [place], [place], [address] Phone: [phone]   Fax:  [phone]  Physical Therapy Treatment  Patient Details  Name: Dustin Keith MRN: [id number] Date of Birth: 04-Dec-1946 Referring Provider: Krystal Eaton   Encounter Date: 11/29/2016      PT End of Session - 11/29/16 0848    Visit Number 6   Number of Visits 8   Date for PT Re-Evaluation 12/17/16   PT Start Time 0846   PT Stop Time 0926   PT Time Calculation (min) 40 min   Activity Tolerance Patient tolerated treatment well   Behavior During Therapy [facility] for tasks assessed/performed      Past Medical History:  Diagnosis Date  . Arthritis   . Cancer (HCC)    DUODENAL   . Hypertension   . Hypothyroidism   . Sleep apnea    CPAP     Past Surgical History:  Procedure Laterality Date  . DUODENAL DIVERTICULECTOMY     02/2015   [facility]  DR EVANS  . KNEE ARTHROSCOPY     RIGHT  . SHOULDER SURGERY     RIGHT    There were no vitals filed for this visit.      Subjective Assessment - 11/29/16 0848    Subjective Pt. reporting he is still feeling relief from shot from MD.     Patient Stated Goals reduce pain, improve function   Currently in Pain? No/denies   Pain Score 0-No pain   Multiple Pain Sites No            [facility] PT Assessment - 11/29/16 0920      Assessment   Medical Diagnosis Trochanteric bursitis of L hip, L hip tendonitis   Referring Provider Krystal Eaton    Next MD Visit 12/01/16             [facility] Adult PT Treatment/Exercise - 11/29/16 0859      Knee/Hip Exercises: Stretches   Passive Hamstring Stretch Left;2 reps;30 seconds   ITB Stretch Both;2 reps;60 seconds   Piriformis Stretch 2 reps;60 seconds     Knee/Hip Exercises: Aerobic   Nustep Level 6 x 7 minutes     Knee/Hip Exercises: Standing   Hip Flexion AROM;Both;1 set;15 reps   Hip Flexion Limitations with red TB around ankles and  treadmill support    Hip Abduction AROM;Both;1 set;15 reps   Abduction Limitations with red TB around ankles and treadmill support    Hip Extension AROM;Both;1 set;10 reps   Extension Limitations with red TB around ankles and treadmill support    Other Standing Knee Exercises side stepping with green tband at ankles 2 x 30 ft    Other Standing Knee Exercises Monster walk with green TB 2 x 30 ft      Knee/Hip Exercises: Sidelying   Hip ABduction Left;10 reps;1 set   Hip ABduction Limitations 3#            PT Long Term Goals - 11/09/16 1111      PT LONG TERM GOAL #1   Title patient to be independent with HEP (12/17/16)   Status On-going     PT LONG TERM GOAL #2   Title Patient to demonstrate or verbalize ability to ambulate greater than >1 hour with pain no greater than 1/10 (12/17/16)   Status On-going     PT LONG TERM GOAL #3   Title Patient to report daily pain no  greater than 1/10 to allow return to daily activities and exercise (12/17/16)   Status On-going     PT LONG TERM GOAL #4   Title Patient to improve L hip abduction strength to >/= 4/5 with no pain (12/17/16)   Status On-going               Plan - 11/29/16 0912    Clinical Impression Statement Pt. reporting LBP/hip pain is still well controlled from shot administered by MD.  Today's treatment focused on progression of standing hip strengthening activity and continue LE stretching to HS, piriformis, ITB.  Pt. tolerated all well with only complaint being occasional, "piriformis tightness" with standing therex.  Pt. progressing well with MD f/u scheduled for 12/13 following next therapy appointment.     PT Treatment/Interventions ADLs/Self Care Home Management;Cryotherapy;Electrical Stimulation;Iontophoresis 4mg /ml Dexamethasone;Moist Heat;Ultrasound;Therapeutic exercise;Therapeutic activities;Patient/family education;Manual techniques;Passive range of motion;Vasopneumatic Device;Taping;Dry needling   PT Next  Visit Plan Progress hip flexibility, pain control; hip strengthening per tolerance       Patient will benefit from skilled therapeutic intervention in order to improve the following deficits and impairments:  Decreased activity tolerance, Impaired flexibility, Difficulty walking, Pain, Decreased strength  Visit Diagnosis: Pain in left hip     Problem List Patient Active Problem List   Diagnosis Date Noted  . Herniated lumbar intervertebral disc 10/23/2015    Bess Harvest, PTA 11/29/16 12:34 PM  [facility] [address]  [place], [place], [address] Phone: [phone]   Fax:  [phone]  Name: Dustin Keith MRN: [id number] Date of Birth: 1946-04-09

## 2016-12-01 ENCOUNTER — Ambulatory Visit: Payer: BLUE CROSS/BLUE SHIELD

## 2016-12-01 DIAGNOSIS — M25552 Pain in left hip: Secondary | ICD-10-CM | POA: Diagnosis not present

## 2016-12-01 NOTE — Therapy (Signed)
[facility] [address]  [place], [place], [address] Phone: [phone]   Fax:  [phone]  Physical Therapy Treatment  Patient Details  Name: Dustin Keith MRN: [id number] Date of Birth: Mar 30, 1946 Referring Provider: Krystal Eaton   Encounter Date: 12/01/2016      PT End of Session - 12/01/16 1324    Visit Number 7   Number of Visits 8   Date for PT Re-Evaluation 12/17/16   PT Start Time 1316   PT Stop Time 1400   PT Time Calculation (min) 44 min   Activity Tolerance Patient tolerated treatment well   Behavior During Therapy [facility] for tasks assessed/performed      Past Medical History:  Diagnosis Date  . Arthritis   . Cancer (HCC)    DUODENAL   . Hypertension   . Hypothyroidism   . Sleep apnea    CPAP     Past Surgical History:  Procedure Laterality Date  . DUODENAL DIVERTICULECTOMY     02/2015   [facility]  DR EVANS  . KNEE ARTHROSCOPY     RIGHT  . SHOULDER SURGERY     RIGHT    There were no vitals filed for this visit.      Subjective Assessment - 12/01/16 1319    Subjective Pt. reporting MD f/u tomorrow.  Pt. reporting mild soreness following last treatment at [facility].   Patient Stated Goals reduce pain, improve function   Currently in Pain? No/denies   Pain Score 0-No pain   Multiple Pain Sites No            [facility] PT Assessment - 12/01/16 1318      Assessment   Medical Diagnosis Trochanteric bursitis of L hip, L hip tendonitis   Referring Provider Krystal Eaton    Next MD Visit 12/02/16   Prior Therapy yes - for back     Strength   Right/Left Hip Right;Left   Right Hip Flexion 5/5   Right Hip Extension 4+/5   Right Hip ABduction 4+/5   Right Hip ADduction 4+/5   Left Hip Flexion 4+/5   Left Hip Extension 4+/5   Left Hip ABduction 4/5   Left Hip ADduction 4+/5   Right/Left Knee Right;Left   Right Knee Flexion 5/5   Right Knee Extension 5/5   Left Knee Flexion 4+/5    Left Knee Extension 5/5                     [facility] Adult PT Treatment/Exercise - 12/01/16 1817      Knee/Hip Exercises: Aerobic   Stationary Bike x 6 min level 4     Knee/Hip Exercises: Standing   Hip Flexion AROM;Both;1 set;15 reps   Hip Flexion Limitations with red TB around ankles and treadmill support    Hip Abduction AROM;Both;1 set;15 reps   Abduction Limitations with red TB around ankles and treadmill support    Hip Extension AROM;Both;1 set;10 reps   Extension Limitations with red TB around ankles and treadmill support    Functional Squat 3 seconds;10 reps   Functional Squat Limitations TRX   Other Standing Knee Exercises side stepping with green tband at ankles 2 x 30 ft    Other Standing Knee Exercises Monster walk forwards/backwards with green TB 2 x 30 ft                 PT Education - 12/01/16 1422  Education provided Yes   Education Details Standing hip flexion, abduction, extension with red TB issued to pt., counter sqaut   Person(s) Educated Patient   Methods Explanation;Demonstration;Verbal cues;Handout   Comprehension Verbalized understanding;Returned demonstration;Verbal cues required;Need further instruction             PT Long Term Goals - 12/01/16 1326      PT LONG TERM GOAL #1   Title patient to be independent with HEP (12/17/16)   Status Partially Met  12.13.17: Pt. met up to this point      PT LONG TERM GOAL #2   Title Patient to demonstrate or verbalize ability to ambulate greater than >1 hour with pain no greater than 1/10 (12/17/16)   Status On-going  Not addressed due to pt. reporting he only walks short distances      PT LONG TERM GOAL #3   Title Patient to report daily pain no greater than 1/10 to allow return to daily activities and exercise (12/17/16)   Status Achieved  12.13.17: Pt. reporting <1/10 pain with daily activities.  Only pain with activities is climbing stairs at night.      PT LONG TERM GOAL #4    Title Patient to improve L hip abduction strength to >/= 4/5 with no pain (12/17/16)   Status Partially Met  12.13.17: pt. able to demo 4/5 L hip abduction strength still with reporting 7/10 pain.                 Plan - 12/01/16 1446    Clinical Impression Statement Pt. still progressing well with hip/LE strengthening therex showing at least half grade improvement with LE strength testing today.  Pt. L hip abduction strength now 4/5 however still with 7/10 pain with this.  Pt. pain free with daily activities however still with pain while climbing stairs at night.  Pt. reporting he is pain free laying on L side in bed for short duration however, pain returns.  Pt. would continue to benefit from further skilled therapy to improve LE strength and reduce pain with functional activities.     PT Treatment/Interventions ADLs/Self Care Home Management;Cryotherapy;Electrical Stimulation;Iontophoresis 51m/ml Dexamethasone;Moist Heat;Ultrasound;Therapeutic exercise;Therapeutic activities;Patient/family education;Manual techniques;Passive range of motion;Vasopneumatic Device;Taping;Dry needling   PT Next Visit Plan Recert; Review machine strengthening activities per pt. request; Progress hip flexibility, pain control; hip strengthening per tolerance       Patient will benefit from skilled therapeutic intervention in order to improve the following deficits and impairments:  Decreased activity tolerance, Impaired flexibility, Difficulty walking, Pain, Decreased strength  Visit Diagnosis: Pain in left hip     Problem List Patient Active Problem List   Diagnosis Date Noted  . Herniated lumbar intervertebral disc 10/23/2015    MBess Harvest PTA 12/01/16 6:25 PM  [facility] [address] [place] [address]Phone: [phone]  Fax:  [phone] Name: Dustin PlucinskiMRN: [id number] of Birth: 1Aug 11, 1947

## 2016-12-08 ENCOUNTER — Ambulatory Visit: Payer: BLUE CROSS/BLUE SHIELD | Admitting: Physical Therapy

## 2016-12-08 DIAGNOSIS — M25552 Pain in left hip: Secondary | ICD-10-CM | POA: Diagnosis not present

## 2016-12-08 NOTE — Therapy (Signed)
[facility] [address]  [place], [place], [address] Phone: [phone]   Fax:  [phone]  Physical Therapy Treatment  Patient Details  Name: Dustin Keith MRN: [id number] Date of Birth: 21-Mar-1946 Referring Provider: Krystal Eaton   Encounter Date: 12/08/2016      PT End of Session - 12/08/16 0943    Visit Number 8   Number of Visits 8   Date for PT Re-Evaluation 12/17/16   PT Start Time 3785   PT Stop Time 0915  Discharge date   PT Time Calculation (min) 29 min   Activity Tolerance Patient tolerated treatment well   Behavior During Therapy [facility] for tasks assessed/performed      Past Medical History:  Diagnosis Date  . Arthritis   . Cancer (HCC)    DUODENAL   . Hypertension   . Hypothyroidism   . Sleep apnea    CPAP     Past Surgical History:  Procedure Laterality Date  . DUODENAL DIVERTICULECTOMY     02/2015   [facility]  DR EVANS  . KNEE ARTHROSCOPY     RIGHT  . SHOULDER SURGERY     RIGHT    There were no vitals filed for this visit.      Subjective Assessment - 12/08/16 0947    Subjective Patient doing well - will d/c today due to leaving for [place] for work related duties; feels like he has progressed well and is able to be independent with all HEP.    Diagnostic tests Xray - L hip - no arthritis    Patient Stated Goals reduce pain, improve function   Currently in Pain? No/denies   Pain Score 0-No pain                         [facility] Adult PT Treatment/Exercise - 12/08/16 0940      Knee/Hip Exercises: Stretches   Passive Hamstring Stretch Both;2 reps;60 seconds   Passive Hamstring Stretch Limitations manual by PT   ITB Stretch --  HEP review   Piriformis Stretch --  HEP review     Knee/Hip Exercises: Aerobic   Stationary Bike level 4 x 6 minutes     Knee/Hip Exercises: Standing   Hip Flexion Limitations HEP review   Abduction Limitations HEP review   Extension Limitations HEP review   Functional Squat Limitations HEP review   Other Standing Knee Exercises side stepping with green tband at ankles 2 x 30 ft    Other Standing Knee Exercises Monster walk forwards with green TB 2 x 30 ft      Knee/Hip Exercises: Supine   Bridges Limitations HEP review     Knee/Hip Exercises: Sidelying   Hip ABduction Strengthening;Left;15 reps   Clams HEP review                PT Education - 12/08/16 0951    Education provided Yes   Education Details HEP, given blue tband   Person(s) Educated Patient   Methods Explanation;Demonstration   Comprehension Verbalized understanding;Returned demonstration             PT Long Term Goals - 12/08/16 0943      PT LONG TERM GOAL #1   Title patient to be independent with HEP (12/17/16)   Status Achieved     PT LONG TERM GOAL #2   Title Patient to demonstrate or verbalize ability to ambulate greater than >1 hour  with pain no greater than 1/10 (12/17/16)   Status Achieved  Patient reporting ability to ambulate for greater than 1 hour with very minimal pain (no greater than 1/10)     PT LONG TERM GOAL #3   Title Patient to report daily pain no greater than 1/10 to allow return to daily activities and exercise (12/17/16)   Status Achieved     PT LONG TERM GOAL #4   Title Patient to improve L hip abduction strength to >/= 4/5 with no pain (12/17/16)   Status Achieved  4/5 - no pain associated with MMT               Plan - 01-02-2017 0945    Clinical Impression Statement Patient has progressed very well with PT intervention to address L hip bursitis and overall reduced strength and flexibility. Patient feels like he has progressed well and is currently not in pain with daily pain levels at or below 1/10. Patient reporitng he is now able to ambulate for greater than 1 hour with reduced pain symptoms demonstrating an improvement in overall functional mobility. Patient with good increase in  hip strength with no associated pain. Today is patients last scheduled visit with PT and patient comfortable with discharge at this time as patient is able to perform comprehensive HEP independently.    Rehab Potential Good   PT Frequency 2x / week   PT Duration 4 weeks   PT Treatment/Interventions ADLs/Self Care Home Management;Cryotherapy;Electrical Stimulation;Iontophoresis 55m/ml Dexamethasone;Moist Heat;Ultrasound;Therapeutic exercise;Therapeutic activities;Patient/family education;Manual techniques;Passive range of motion;Vasopneumatic Device;Taping;Dry needling   PT Next Visit Plan Discharge from PT on this date   Consulted and Agree with Plan of Care Patient      Patient will benefit from skilled therapeutic intervention in order to improve the following deficits and impairments:  Decreased activity tolerance, Impaired flexibility, Difficulty walking, Pain, Decreased strength  Visit Diagnosis: Pain in left hip       G-Codes - 101/14/181647    Functional Assessment Tool Used Clinical judgement, patient report: improved ability to ambulate for greater than 1 hour, has improved hip strength to 4/5 with no pain with MMT; continues to lack good HS, IT band, and piriformis flexibility, some pain with initiation of glute/hip strengthening but does resolve, slight conitnued limp with ambulation.    Functional Limitation Mobility: Walking and moving around   Mobility: Walking and Moving Around Goal Status [phone] At least 20 percent but less than 40 percent impaired, limited or restricted   Mobility: Walking and Moving Around Discharge Status [phone] At least 20 percent but less than 40 percent impaired, limited or restricted      Problem List Patient Active Problem List   Diagnosis Date Noted  . Herniated lumbar intervertebral disc 10/23/2015     SLanney Gins PT, DPT [id number] PM    PHYSICAL THERAPY DISCHARGE SUMMARY  Visits from Start of Care: 8  Current  functional level related to goals / functional outcomes: See above   Remaining deficits: Continued tightness of HS, IT band and piriformis, slight pain with initiation of glute/hip strengthening tasks, but easily resolves.    Education / Equipment: HEP  Plan: Patient agrees to discharge.  Patient goals were met. Patient is being discharged due to meeting the stated rehab goals.  ?????     SLanney Gins PT, DPT 101-14-184:50 PM  [facility] [place] [address]Phone: [phone]  Fax:  [phone]  Name: Russel Morain MRN: [id number] Date of Birth: 1946-02-22

## 2016-12-15 IMAGING — CR DG LUMBAR SPINE 1V
1 series · 1 of 1 positions shown · non-contrast
Comparison: MRI of the lumbar spine July 28, 2015 and lumbar
spine series of April 21, 2015.

CLINICAL DATA: Redo of L2-3 and L5-S1 decompression and fusion with
exploration of L3 through L5.

EXAM:
LUMBAR SPINE - 1 VIEW is images labeled #1.

[lat]
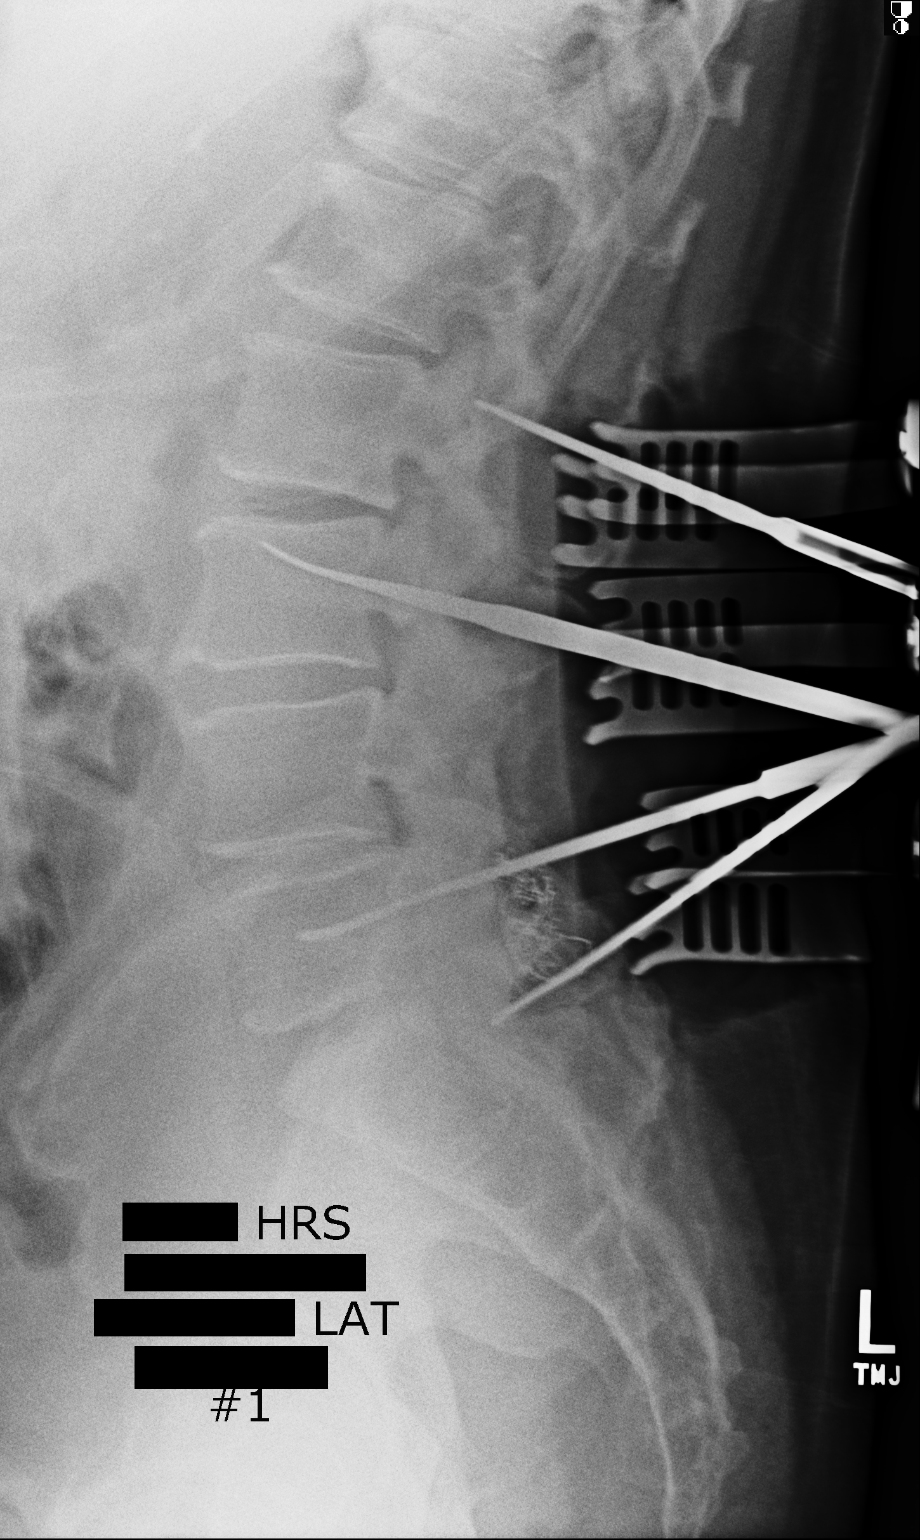

[1 of 1 positions shown; findings below may reference images not displayed]

FINDINGS: Metallic localization devices are present projecting a from L2
through S1. The device at L2 overlies the facet joints of L1-2. The
device at L3 overlies the anterior superior aspect of the vertebral
body. There is no metallic trocar overlying the L4 level. At L5 the
upper trocar overlies the mid aspect of the vertebral body. The
lower trocar projects 1 cm posterior to the midbody of S1. There
surgical sponge material overlying the L5 spinous process. Tissue
spreader devices extend from the L2 spinous process to the L5
spinous process.
IMPRESSION: Intraoperative localization lateral lumbar spine view as described.

## 2017-11-27 ENCOUNTER — Ambulatory Visit: Payer: Self-pay | Admitting: Orthopedic Surgery

## 2017-12-02 ENCOUNTER — Ambulatory Visit: Payer: Self-pay | Admitting: Orthopedic Surgery

## 2017-12-02 NOTE — H&P (Signed)
D[place] Keith DOB: Sep 03, 1946 Married / Language: English / Race: White Male Date of Admission:  12/28/2017 CC:  Left hip pain History of Present Illness  The patient is a 71 year old male who comes in for a preoperative History and Physical. The patient is scheduled for a left total hip arthroplasty (anterior) to be performed by Dr. Dione Plover. [name], MD at [facility] on 12-28-2017. The patient is a 71 year old male who presented for their left hip pain. He ahs been treated conservatively for his hip painfor some time now. He is months from IA injection into left hip. Symptoms reported include: pain, aching, stiffness, pain with weightbearing, difficulty ambulating and difficulty arising from chair. The patient feels that they are doing poorly and report their pain level to be severe. The following medication has been used for pain control: antiinflammatory medication (ibuprofen; occasionally at night). He continues with the severe pain in his LEFT groin and anterior thigh. The hip injection helped for a couple hours but then the pain came RIGHT back. While the local anesthetic was working he did feel somewhat better. He is at a stage now he feels that he needs to get something done about this because it is causing progressively worsening problems. He is ready to proceed with surgical treatment. They have been treated conservatively in the past for the above stated problem and despite conservative measures, they continue to have progressive pain and severe functional limitations and dysfunction. They have failed non-operative management including home exercise, medications, and injections. It is felt that they would benefit from undergoing total joint replacement. Risks and benefits of the procedure have been discussed with the patient and they elect to proceed with surgery. There are no active contraindications to surgery such as ongoing infection or rapidly progressive neurological  disease.  Problem List/Past Medical Tear of left acetabular labrum, subsequent encounter (S73.192D)  Trochanteric bursitis of left hip (M70.62)  Primary osteoarthritis of left hip (M16.12)  Hip tendonitis, left (Q20.601)  High blood pressure  Hypothyroidism  Sleep Apnea  uses CPAP Tinnitus  Duodenal Cancer   Allergies  No Known Drug Allergies   Family History  Cancer  Mother, Sister. Congestive Heart Failure  Father.  Social History Children  2 Current work status  retired Furniture conservator/restorer weekly; does running / walking and other Living situation  live with spouse Marital status  married No history of drug/alcohol rehab  Not under pain contract  Tobacco / smoke exposure  11/03/2016: no Tobacco use  Never smoker. 11/03/2016  Medication History Ibuprofen (200MG  Tablet, Oral as needed) Active. Levothyroxine Sodium (100MCG Tablet, Oral) Active. Testosterone Cypionate (200MG /ML Solution, Intramuscular every 10 days) Active. Vitamin C (Oral) Specific strength unknown - Active. Mens Multi Vitamin & Mineral (Oral) Active. Aspirin (81MG  Tablet, Oral) Active. Tylenol (Oral as needed) Specific strength unknown - Active. Niacin (Oral) Specific strength unknown - Active. Methyl B-12 (5068mcg Oral) Specific strength unknown - Active. B Complex (Oral) Active.  Past Surgical History Arthroscopy of Knee  right Inguinal Hernia Repair  open: bilateral Rotator Cuff Repair  right Spinal Fusion  lower back Spinal Surgery  Vasectomy   Review of Systems  General Not Present- Chills, Fatigue, Fever, Memory Loss, Night Sweats, Weight Gain and Weight Loss. Skin Not Present- Eczema, Hives, Itching, Lesions and Rash. HEENT Present- Hearing Loss and Tinnitus. Not Present- Dentures, Double Vision, Headache and Visual Loss. Respiratory Not Present- Allergies, Chronic Cough, Coughing up blood, Shortness of breath at rest and Shortness  of breath with  exertion. Cardiovascular Not Present- Chest Pain, Difficulty Breathing Lying Down, Murmur, Palpitations, Racing/skipping heartbeats and Swelling. Gastrointestinal Not Present- Abdominal Pain, Bloody Stool, Constipation, Diarrhea, Difficulty Swallowing, Heartburn, Jaundice, Loss of appetitie, Nausea and Vomiting. Male Genitourinary Present- Urinating at Night. Not Present- Blood in Urine, Discharge, Flank Pain, Incontinence, Painful Urination, Urgency, Urinary frequency, Urinary Retention and Weak urinary stream. Musculoskeletal Present- Joint Pain. Not Present- Back Pain, Joint Swelling, Morning Stiffness, Muscle Pain, Muscle Weakness and Spasms. Neurological Not Present- Blackout spells, Difficulty with balance, Dizziness, Paralysis, Tremor and Weakness. Psychiatric Not Present- Insomnia.  Vitals Weight: 240 lb Height: 70in Weight was reported by patient. Height was reported by patient. Body Surface Area: 2.26 m Body Mass Index: 34.44 kg/m  Pulse: 76 (Regular)  BP: 152/86 (Sitting, Right Arm, Standard)  Physical Exam General Mental Status -Alert, cooperative and good historian. General Appearance-pleasant, Not in acute distress. Orientation-Oriented X3. Build & Nutrition-Well nourished and Well developed.  Head and Neck Head-normocephalic, atraumatic . Neck Global Assessment - supple, no bruit auscultated on the right, no bruit auscultated on the left.  Eye Vision-Wears corrective lenses. Pupil - Bilateral-Regular and Round. Motion - Bilateral-EOMI.  Chest and Lung Exam Auscultation Breath sounds - clear at anterior chest wall and clear at posterior chest wall. Adventitious sounds - No Adventitious sounds.  Cardiovascular Auscultation Rhythm - Regular rate and rhythm. Heart Sounds - S1 WNL and S2 WNL. Murmurs & Other Heart Sounds - Auscultation of the heart reveals - No Murmurs.  Abdomen Palpation/Percussion Tenderness - Abdomen is  non-tender to palpation. Rigidity (guarding) - Abdomen is soft. Auscultation Auscultation of the abdomen reveals - Bowel sounds normal.  Male Genitourinary Note: Not done, not pertinent to present illness   Musculoskeletal Note: Examination of the right hip shows flexion to 120 rotation in 30 abduction 40 and external rotation of 40. There is no tenderness over the greater trochanter. There is no pain on provocative testing of the hip. His LEFT hip can be flexed to 90 with minimal internal rotation about 20 of external rotation and 30 of abduction. He has significant pain on range of motion of the LEFT hip. His gait pattern is significantly antalgic on that side.   Assessment & Plan Primary osteoarthritis of left hip (M16.12)  Note:Surgical Plans: Left Total Hip Replacement - Anterior Approach  Disposition: Home with wife, HHPT  PCP: Dr. Raeanne Gathers - Patient has been seen preoperatively and felt to be stable for surgery. "Hold ASA a week prior tp surgery. Restart ASAP."  Topical TXA- Duodenal Cancer  Anesthesia Issues: Some nausea with anesthesia  Patient was instructed on what medications to stop prior to surgery.  Signed electronically by Joelene Millin, III PA-C

## 2017-12-02 NOTE — H&P (Signed)
Dustin Keith DOB: 06-21-1946 Married / Language: English / Race: White Male Date of Admission:  12/28/2017 CC:  Left hip pain History of Present Illness  The patient is a 71 year old male who comes in for a preoperative History and Physical. The patient is scheduled for a left total hip arthroplasty (anterior) to be performed by Dr. Dione Plover. [name], MD at [name] on 12-28-2017. The patient is a 71 year old male who presented for their left hip pain. He ahs been treated conservatively for his hip painfor some time now. He is months from IA injection into left hip. Symptoms reported include: pain, aching, stiffness, pain with weightbearing, difficulty ambulating and difficulty arising from chair. The patient feels that they are doing poorly and report their pain level to be severe. The following medication has been used for pain control: antiinflammatory medication (ibuprofen; occasionally at night). He continues with the severe pain in his LEFT groin and anterior thigh. The hip injection helped for a couple hours but then the pain came RIGHT back. While the local anesthetic was working he did feel somewhat better. He is at a stage now he feels that he needs to get something done about this because it is causing progressively worsening problems. He is ready to proceed with surgical treatment. They have been treated conservatively in the past for the above stated problem and despite conservative measures, they continue to have progressive pain and severe functional limitations and dysfunction. They have failed non-operative management including home exercise, medications, and injections. It is felt that they would benefit from undergoing total joint replacement. Risks and benefits of the procedure have been discussed with the patient and they elect to proceed with surgery. There are no active contraindications to surgery such as ongoing infection or rapidly progressive neurological  disease.  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 Congestive Heart Failure  Father.  Social History Children  2 Current work status  retired Furniture conservator/restorer weekly; does running / walking and other Living situation  live with spouse Marital status  married No history of drug/alcohol rehab  Not under pain contract  Tobacco / smoke exposure  11/03/2016: no Tobacco use  Never smoker. 11/03/2016  Medication History Ibuprofen (200MG  Tablet, Oral as needed) Active. Levothyroxine Sodium (100MCG Tablet, Oral) Active. Testosterone Cypionate (200MG /ML Solution, Intramuscular every 10 days) Active. Vitamin C (Oral) Specific strength unknown - Active. Mens Multi Vitamin & Mineral (Oral) Active. Aspirin (81MG  Tablet, Oral) Active. Tylenol (Oral as needed) Specific strength unknown - Active. Niacin (Oral) Specific strength unknown - Active. Methyl B-12 (504mcg Oral) Specific strength unknown - Active. B Complex (Oral) Active.  Past Surgical History Arthroscopy of Knee  right Inguinal Hernia Repair  open: bilateral Rotator Cuff Repair  right Spinal Fusion  lower back Spinal Surgery  Vasectomy   Review of Systems  General Not Present- Chills, Fatigue, Fever, Memory Loss, Night Sweats, Weight Gain and Weight Loss. Skin Not Present- Eczema, Hives, Itching, Lesions and Rash. HEENT Present- Hearing Loss and Tinnitus. Not Present- Dentures, Double Vision, Headache and Visual Loss. Respiratory Not Present- Allergies, Chronic Cough, Coughing up blood, Shortness of breath at rest and Shortness  of breath with  exertion. Cardiovascular Not Present- Chest Pain, Difficulty Breathing Lying Down, Murmur, Palpitations, Racing/skipping heartbeats and Swelling. Gastrointestinal Not Present- Abdominal Pain, Bloody Stool, Constipation, Diarrhea, Difficulty Swallowing, Heartburn, Jaundice, Loss of appetitie, Nausea and Vomiting. Male Genitourinary Present- Urinating at Night. Not Present- Blood in Urine, Discharge, Flank Pain, Incontinence, Painful Urination, Urgency, Urinary frequency, Urinary Retention and Weak urinary stream. Musculoskeletal Present- Joint Pain. Not Present- Back Pain, Joint Swelling, Morning Stiffness, Muscle Pain, Muscle Weakness and Spasms. Neurological Not Present- Blackout spells, Difficulty with balance, Dizziness, Paralysis, Tremor and Weakness. Psychiatric Not Present- Insomnia.  Vitals Weight: 240 lb Height: 70in Weight was reported by patient. Height was reported by patient. Body Surface Area: 2.26 m Body Mass Index: 34.44 kg/m  Pulse: 76 (Regular)  BP: 152/86 (Sitting, Right Arm, Standard)  Physical Exam General Mental Status -Alert, cooperative and good historian. General Appearance-pleasant, Not in acute distress. Orientation-Oriented X3. Build & Nutrition-Well nourished and Well developed.  Head and Neck Head-normocephalic, atraumatic . Neck Global Assessment - supple, no bruit auscultated on the right, no bruit auscultated on the left.  Eye Vision-Wears corrective lenses. Pupil - Bilateral-Regular and Round. Motion - Bilateral-EOMI.  Chest and Lung Exam Auscultation Breath sounds - clear at anterior chest wall and clear at posterior chest wall. Adventitious sounds - No Adventitious sounds.  Cardiovascular Auscultation Rhythm - Regular rate and rhythm. Heart Sounds - S1 WNL and S2 WNL. Murmurs & Other Heart Sounds - Auscultation of the heart reveals - No Murmurs.  Abdomen Palpation/Percussion Tenderness - Abdomen is non-tender to  palpation. Rigidity (guarding) - Abdomen is soft. Auscultation Auscultation of the abdomen reveals - Bowel sounds normal.  Male Genitourinary Note: Not done, not pertinent to present illness   Musculoskeletal Note: Examination of the right hip shows flexion to 120 rotation in 30 abduction 40 and external rotation of 40. There is no tenderness over the greater trochanter. There is no pain on provocative testing of the hip. His LEFT hip can be flexed to 90 with minimal internal rotation about 20 of external rotation and 30 of abduction. He has significant pain on range of motion of the LEFT hip. His gait pattern is significantly antalgic on that side.   Assessment & Plan Primary osteoarthritis of left hip (M16.12)  Note:Surgical Plans: Left Total Hip Replacement - Anterior Approach  Disposition: Home with wife, HHPT  PCP: Dr. Raeanne Gathers - Patient has been seen preoperatively and felt to be stable for surgery. "Hold ASA a week prior tp surgery. Restart ASAP."  Topical TXA- Duodenal Cancer  Anesthesia Issues: Some nausea with anesthesia  Patient was instructed on what medications to stop prior to surgery.  Signed electronically by Joelene Millin, III PA-C

## 2017-12-22 NOTE — Patient Instructions (Addendum)
Dustin Keith  12/22/2017   Your procedure is scheduled on: 12-28-17   Report to [facility] Main  Entrance Report to Admitting at 6:15 AM   Call this number if you have problems the morning of surgery [phone]    Do not eat food or drink liquids :After Midnight.     Take these medicines the morning of surgery with A SIP OF WATER: Levothyroxine (Synthroid)                                You may not have any metal on your body including hair pins and              piercings  Do not wear jewelry, lotions, powders or deodorant             Men may shave face and neck.   Do not bring valuables to the hospital. [place].  Contacts, dentures or bridgework may not be worn into surgery.  Leave suitcase in the car. After surgery it may be brought to your room.    Please bring your masking and tubing for your CPAP machine             Please read over the following fact sheets you were given: _____________________________________________________________________          [facility] - Preparing for Surgery Before surgery, you can play an important role.  Because skin is not sterile, your skin needs to be as free of germs as possible.  You can reduce the number of germs on your skin by washing with CHG (chlorahexidine gluconate) soap before surgery.  CHG is an antiseptic cleaner which kills germs and bonds with the skin to continue killing germs even after washing. Please DO NOT use if you have an allergy to CHG or antibacterial soaps.  If your skin becomes reddened/irritated stop using the CHG and inform your nurse when you arrive at Short Stay. Do not shave (including legs and underarms) for at least 48 hours prior to the first CHG shower.  You may shave your face/neck. Please follow these instructions carefully:  1.  Shower with CHG Soap the night before surgery and the  morning of Surgery.  2.  If you choose to wash your  hair, wash your hair first as usual with your  normal  shampoo.  3.  After you shampoo, rinse your hair and body thoroughly to remove the  shampoo.                           4.  Use CHG as you would any other liquid soap.  You can apply chg directly  to the skin and wash                       Gently with a scrungie or clean washcloth.  5.  Apply the CHG Soap to your body ONLY FROM THE NECK DOWN.   Do not use on face/ open                           Wound or open sores. Avoid contact with eyes, ears  mouth and genitals (private parts).                       Wash face,  Genitals (private parts) with your normal soap.             6.  Wash thoroughly, paying special attention to the area where your surgery  will be performed.  7.  Thoroughly rinse your body with warm water from the neck down.  8.  DO NOT shower/wash with your normal soap after using and rinsing off  the CHG Soap.                9.  Pat yourself dry with a clean towel.            10.  Wear clean pajamas.            11.  Place clean sheets on your bed the night of your first shower and do not  sleep with pets. Day of Surgery : Do not apply any lotions/deodorants the morning of surgery.  Please wear clean clothes to the hospital/surgery center.  FAILURE TO FOLLOW THESE INSTRUCTIONS MAY RESULT IN THE CANCELLATION OF YOUR SURGERY PATIENT SIGNATURE_________________________________  NURSE SIGNATURE__________________________________  ________________________________________________________________________   Adam Phenix  An incentive spirometer is a tool that can help keep your lungs clear and active. This tool measures how well you are filling your lungs with each breath. Taking long deep breaths may help reverse or decrease the chance of developing breathing (pulmonary) problems (especially infection) following:  A long period of time when you are unable to move or be active. BEFORE THE PROCEDURE   If the spirometer  includes an indicator to show your best effort, your nurse or respiratory therapist will set it to a desired goal.  If possible, sit up straight or lean slightly forward. Try not to slouch.  Hold the incentive spirometer in an upright position. INSTRUCTIONS FOR USE  1. Sit on the edge of your bed if possible, or sit up as far as you can in bed or on a chair. 2. Hold the incentive spirometer in an upright position. 3. Breathe out normally. 4. Place the mouthpiece in your mouth and seal your lips tightly around it. 5. Breathe in slowly and as deeply as possible, raising the piston or the ball toward the top of the column. 6. Hold your breath for 3-5 seconds or for as long as possible. Allow the piston or ball to fall to the bottom of the column. 7. Remove the mouthpiece from your mouth and breathe out normally. 8. Rest for a few seconds and repeat Steps 1 through 7 at least 10 times every 1-2 hours when you are awake. Take your time and take a few normal breaths between deep breaths. 9. The spirometer may include an indicator to show your best effort. Use the indicator as a goal to work toward during each repetition. 10. After each set of 10 deep breaths, practice coughing to be sure your lungs are clear. If you have an incision (the cut made at the time of surgery), support your incision when coughing by placing a pillow or rolled up towels firmly against it. Once you are able to get out of bed, walk around indoors and cough well. You may stop using the incentive spirometer when instructed by your caregiver.  RISKS AND COMPLICATIONS  Take your time so you do not get dizzy or light-headed.  If you are in pain,  you may need to take or ask for pain medication before doing incentive spirometry. It is harder to take a deep breath if you are having pain. AFTER USE  Rest and breathe slowly and easily.  It can be helpful to keep track of a log of your progress. Your caregiver can provide you with a  simple table to help with this. If you are using the spirometer at home, follow these instructions: [place] IF:   You are having difficultly using the spirometer.  You have trouble using the spirometer as often as instructed.  Your pain medication is not giving enough relief while using the spirometer.  You develop fever of 100.5 F (38.1 C) or higher. SEEK IMMEDIATE MEDICAL CARE IF:   You cough up bloody sputum that had not been present before.  You develop fever of 102 F (38.9 C) or greater.  You develop worsening pain at or near the incision site. MAKE SURE YOU:   Understand these instructions.  Will watch your condition.  Will get help right away if you are not doing well or get worse. Document Released: 04/18/2007 Document Revised: 02/28/2012 Document Reviewed: 06/19/2007 Exit[employer] Patient Information 2014 Exit[employer], [place].   ________________________________________________________________________  WHAT IS A BLOOD TRANSFUSION? Blood Transfusion Information  A transfusion is the replacement of blood or some of its parts. Blood is made up of multiple cells which provide different functions.  Red blood cells carry oxygen and are used for blood loss replacement.  White blood cells fight against infection.  Platelets control bleeding.  Plasma helps clot blood.  Other blood products are available for specialized needs, such as hemophilia or other clotting disorders. BEFORE THE TRANSFUSION  Who gives blood for transfusions?   Healthy volunteers who are fully evaluated to make sure their blood is safe. This is blood bank blood. Transfusion therapy is the safest it has ever been in the practice of medicine. Before blood is taken from a donor, a complete history is taken to make sure that person has no history of diseases nor engages in risky social behavior (examples are intravenous drug use or sexual activity with multiple partners). The donor's travel history  is screened to minimize risk of transmitting infections, such as malaria. The donated blood is tested for signs of infectious diseases, such as HIV and hepatitis. The blood is then tested to be sure it is compatible with you in order to minimize the chance of a transfusion reaction. If you or a relative donates blood, this is often done in anticipation of surgery and is not appropriate for emergency situations. It takes many days to process the donated blood. RISKS AND COMPLICATIONS Although transfusion therapy is very safe and saves many lives, the main dangers of transfusion include:   Getting an infectious disease.  Developing a transfusion reaction. This is an allergic reaction to something in the blood you were given. Every precaution is taken to prevent this. The decision to have a blood transfusion has been considered carefully by your caregiver before blood is given. Blood is not given unless the benefits outweigh the risks. AFTER THE TRANSFUSION  Right after receiving a blood transfusion, you will usually feel much better and more energetic. This is especially true if your red blood cells have gotten low (anemic). The transfusion raises the level of the red blood cells which carry oxygen, and this usually causes an energy increase.  The nurse administering the transfusion will monitor you carefully for complications. HOME CARE INSTRUCTIONS  No special instructions are needed after a transfusion. You may find your energy is better. Speak with your caregiver about any limitations on activity for underlying diseases you may have. SEEK MEDICAL CARE IF:   Your condition is not improving after your transfusion.  You develop redness or irritation at the intravenous (IV) site. SEEK IMMEDIATE MEDICAL CARE IF:  Any of the following symptoms occur over the next 12 hours:  Shaking chills.  You have a temperature by mouth above 102 F (38.9 C), not controlled by medicine.  Chest, back, or  muscle pain.  People around you feel you are not acting correctly or are confused.  Shortness of breath or difficulty breathing.  Dizziness and fainting.  You get a rash or develop hives.  You have a decrease in urine output.  Your urine turns a dark color or changes to pink, red, or brown. Any of the following symptoms occur over the next 10 days:  You have a temperature by mouth above 102 F (38.9 C), not controlled by medicine.  Shortness of breath.  Weakness after normal activity.  The white part of the eye turns yellow (jaundice).  You have a decrease in the amount of urine or are urinating less often.  Your urine turns a dark color or changes to pink, red, or brown. Document Released: 12/03/2000 Document Revised: 02/28/2012 Document Reviewed: 07/22/2008 [facility] Patient Information 2014 [place], [place].  _______________________________________________________________________

## 2017-12-22 NOTE — Progress Notes (Signed)
10-24-17 Surgical clearance from Dr. Raeanne Gathers on chart.

## 2017-12-23 ENCOUNTER — Encounter (HOSPITAL_COMMUNITY)
Admission: RE | Admit: 2017-12-23 | Discharge: 2017-12-23 | Disposition: A | Discharge status: Home / Self Care | Payer: BLUE CROSS/BLUE SHIELD | Source: Ambulatory Visit | Attending: Orthopedic Surgery | Admitting: Orthopedic Surgery

## 2017-12-23 ENCOUNTER — Encounter (HOSPITAL_COMMUNITY): Payer: Self-pay

## 2017-12-23 ENCOUNTER — Other Ambulatory Visit: Payer: Self-pay

## 2017-12-23 DIAGNOSIS — I10 Essential (primary) hypertension: Secondary | ICD-10-CM | POA: Diagnosis not present

## 2017-12-23 DIAGNOSIS — R9431 Abnormal electrocardiogram [ECG] [EKG]: Secondary | ICD-10-CM | POA: Diagnosis not present

## 2017-12-23 DIAGNOSIS — M1612 Unilateral primary osteoarthritis, left hip: Secondary | ICD-10-CM | POA: Diagnosis not present

## 2017-12-23 DIAGNOSIS — Z01812 Encounter for preprocedural laboratory examination: Secondary | ICD-10-CM | POA: Diagnosis present

## 2017-12-23 DIAGNOSIS — Z01810 Encounter for preprocedural cardiovascular examination: Secondary | ICD-10-CM | POA: Insufficient documentation

## 2017-12-23 LAB — COMPREHENSIVE METABOLIC PANEL
ALBUMIN: 4 g/dL (ref 3.5–5.0)
ALT: 24 U/L (ref 17–63)
ANION GAP: 7 (ref 5–15)
AST: 26 U/L (ref 15–41)
Alkaline Phosphatase: 48 U/L (ref 38–126)
BILIRUBIN TOTAL: 0.6 mg/dL (ref 0.3–1.2)
BUN: 20 mg/dL (ref 6–20)
CHLORIDE: 105 mmol/L (ref 101–111)
CO2: 24 mmol/L (ref 22–32)
Calcium: 8.9 mg/dL (ref 8.9–10.3)
Creatinine, Ser: 1.1 mg/dL (ref 0.61–1.24)
GFR calc Af Amer: >60 mL/min (ref >60)
GFR calc non Af Amer: >60 mL/min (ref >60)
GLUCOSE: 97 mg/dL (ref 65–99)
POTASSIUM: 4.1 mmol/L (ref 3.5–5.1)
Sodium: 136 mmol/L (ref 135–145)
TOTAL PROTEIN: 7 g/dL (ref 6.5–8.1)

## 2017-12-23 LAB — CBC
HEMATOCRIT: 46.2 % (ref 39.0–52.0)
Hemoglobin: 15.7 g/dL (ref 13.0–17.0)
MCH: 31.8 pg (ref 26.0–34.0)
MCHC: 34 g/dL (ref 30.0–36.0)
MCV: 93.7 fL (ref 78.0–100.0)
PLATELETS: 202 10*3/uL (ref 150–400)
RBC: 4.93 MIL/uL (ref 4.22–5.81)
RDW: 13.9 % (ref 11.5–15.5)
WBC: 7.2 10*3/uL (ref 4.0–10.5)

## 2017-12-23 LAB — APTT: APTT: 29 s (ref 24–36)

## 2017-12-23 LAB — PROTIME-INR
INR: 1.01
PROTHROMBIN TIME: 13.2 s (ref 11.4–15.2)

## 2017-12-23 LAB — SURGICAL PCR SCREEN
MRSA, PCR: NEGATIVE
STAPHYLOCOCCUS AUREUS: POSITIVE — AB

## 2017-12-23 LAB — ABO/RH: ABO/RH(D): O POS

## 2017-12-26 NOTE — Progress Notes (Signed)
12-23-17 PCR result routed to Dr. Wynelle Link for review.

## 2017-12-27 NOTE — Anesthesia Preprocedure Evaluation (Addendum)
Anesthesia Evaluation  Patient identified by MRN, date of birth, ID band Patient awake    Reviewed: Allergy & Precautions, NPO status , Patient's Chart, lab work & pertinent test results  Airway Mallampati: II  TM Distance: >3 FB Neck ROM: Full    Dental no notable dental hx.    Pulmonary neg pulmonary ROS, sleep apnea and Continuous Positive Airway Pressure Ventilation ,    Pulmonary exam normal breath sounds clear to auscultation       Cardiovascular hypertension, negative cardio ROS Normal cardiovascular exam Rhythm:Regular Rate:Normal     Neuro/Psych negative neurological ROS  negative psych ROS   GI/Hepatic negative GI ROS, Neg liver ROS,   Endo/Other  negative endocrine ROS  Renal/GU negative Renal ROS  negative genitourinary   Musculoskeletal negative musculoskeletal ROS (+) Spinal Fusion L3-S1   Abdominal   Peds negative pediatric ROS (+)  Hematology negative hematology ROS (+) Prothrombin Time 11.4 - 15.2 seconds 13.2  INR  1.01      Anesthesia Other Findings WBC 4.0 - 10.5 K/uL 7.2  RBC 4.22 - 5.81 MIL/uL 4.93  Hemoglobin 13.0 - 17.0 g/dL 15.7  Sodium 135 - 145 mmol/L 136  Potassium 3.5 - 5.1 mmol/L 4.1  Chloride 101 - 111 mmol/L 105  CO2 22 - 32 mmol/L 24  Glucose, Bld 65 - 99 mg/dL 97  BUN 6 - 20 mg/dL 20     Reproductive/Obstetrics negative OB ROS                            Anesthesia Physical Anesthesia Plan  ASA: III  Anesthesia Plan: General   Post-op Pain Management:    Induction: Intravenous  PONV Risk Score and Plan: 1  Airway Management Planned: Oral ETT  Additional Equipment:   Intra-op Plan:   Post-operative Plan: Extubation in OR  Informed Consent: I have reviewed the patients History and Physical, chart, labs and discussed the procedure including the risks, benefits and alternatives for the proposed anesthesia with the patient or authorized  representative who has indicated his/her understanding and acceptance.     Plan Discussed with: CRNA and Anesthesiologist  Anesthesia Plan Comments:       Anesthesia Quick Evaluation

## 2017-12-28 ENCOUNTER — Inpatient Hospital Stay (HOSPITAL_COMMUNITY): Payer: BLUE CROSS/BLUE SHIELD | Admitting: Anesthesiology

## 2017-12-28 ENCOUNTER — Other Ambulatory Visit: Payer: Self-pay

## 2017-12-28 ENCOUNTER — Inpatient Hospital Stay (HOSPITAL_COMMUNITY): Payer: BLUE CROSS/BLUE SHIELD

## 2017-12-28 ENCOUNTER — Encounter (HOSPITAL_COMMUNITY)
Admission: RE | Disposition: A | Discharge status: Home Health | Payer: Self-pay | Source: Ambulatory Visit | Attending: Orthopedic Surgery

## 2017-12-28 ENCOUNTER — Encounter (HOSPITAL_COMMUNITY): Payer: Self-pay | Admitting: Emergency Medicine

## 2017-12-28 ENCOUNTER — Inpatient Hospital Stay (HOSPITAL_COMMUNITY)
Admission: RE | Admit: 2017-12-28 | Discharge: 2017-12-29 | DRG: 470 | Disposition: A | Discharge status: Home Health | Payer: BLUE CROSS/BLUE SHIELD | Source: Ambulatory Visit | Attending: Orthopedic Surgery | Admitting: Orthopedic Surgery

## 2017-12-28 DIAGNOSIS — Z7982 Long term (current) use of aspirin: Secondary | ICD-10-CM

## 2017-12-28 DIAGNOSIS — M7062 Trochanteric bursitis, left hip: Secondary | ICD-10-CM | POA: Diagnosis present

## 2017-12-28 DIAGNOSIS — G4730 Sleep apnea, unspecified: Secondary | ICD-10-CM | POA: Diagnosis present

## 2017-12-28 DIAGNOSIS — Z96649 Presence of unspecified artificial hip joint: Secondary | ICD-10-CM

## 2017-12-28 DIAGNOSIS — M1612 Unilateral primary osteoarthritis, left hip: Principal | ICD-10-CM | POA: Diagnosis present

## 2017-12-28 DIAGNOSIS — C170 Malignant neoplasm of duodenum: Secondary | ICD-10-CM | POA: Diagnosis present

## 2017-12-28 DIAGNOSIS — Z981 Arthrodesis status: Secondary | ICD-10-CM | POA: Diagnosis not present

## 2017-12-28 DIAGNOSIS — M169 Osteoarthritis of hip, unspecified: Secondary | ICD-10-CM | POA: Diagnosis present

## 2017-12-28 DIAGNOSIS — I10 Essential (primary) hypertension: Secondary | ICD-10-CM | POA: Diagnosis present

## 2017-12-28 DIAGNOSIS — Z79890 Hormone replacement therapy (postmenopausal): Secondary | ICD-10-CM | POA: Diagnosis not present

## 2017-12-28 DIAGNOSIS — E039 Hypothyroidism, unspecified: Secondary | ICD-10-CM | POA: Diagnosis present

## 2017-12-28 DIAGNOSIS — M25552 Pain in left hip: Secondary | ICD-10-CM | POA: Diagnosis present

## 2017-12-28 HISTORY — PX: TOTAL HIP ARTHROPLASTY: SHX124

## 2017-12-28 LAB — TYPE AND SCREEN
ABO/RH(D): O POS
ANTIBODY SCREEN: NEGATIVE

## 2017-12-28 SURGERY — ARTHROPLASTY, HIP, TOTAL, ANTERIOR APPROACH
Anesthesia: General | Site: Hip | Laterality: Left

## 2017-12-28 MED ORDER — FENTANYL CITRATE (PF) 250 MCG/5ML IJ SOLN
INTRAMUSCULAR | Status: AC
  Filled 2017-12-28: qty 5

## 2017-12-28 MED ORDER — PHENOL 1.4 % MT LIQD: 1.0000 | OROMUCOSAL | Status: DC | PRN

## 2017-12-28 MED ORDER — LACTATED RINGERS IV SOLN
INTRAVENOUS | Status: DC
  Administered 2017-12-28 (×3): via INTRAVENOUS

## 2017-12-28 MED ORDER — ONDANSETRON HCL 4 MG/2ML IJ SOLN
INTRAMUSCULAR | Status: DC | PRN
  Administered 2017-12-28: 4 mg via INTRAVENOUS

## 2017-12-28 MED ORDER — ROCURONIUM BROMIDE 10 MG/ML (PF) SYRINGE
PREFILLED_SYRINGE | INTRAVENOUS | Status: DC | PRN
  Administered 2017-12-28: 10 mg via INTRAVENOUS
  Administered 2017-12-28: 50 mg via INTRAVENOUS
  Administered 2017-12-28: 10 mg via INTRAVENOUS

## 2017-12-28 MED ORDER — ACETAMINOPHEN 650 MG RE SUPP: 650.0000 mg | RECTAL | Status: DC | PRN

## 2017-12-28 MED ORDER — ACETAMINOPHEN 10 MG/ML IV SOLN
1000.0000 mg | Freq: Once | INTRAVENOUS | Status: AC
  Administered 2017-12-28: 1000 mg via INTRAVENOUS
  Filled 2017-12-28: qty 100

## 2017-12-28 MED ORDER — PROPOFOL 10 MG/ML IV BOLUS
INTRAVENOUS | Status: AC
  Filled 2017-12-28: qty 60

## 2017-12-28 MED ORDER — METHOCARBAMOL 1000 MG/10ML IJ SOLN
500.0000 mg | Freq: Four times a day (QID) | INTRAVENOUS | Status: DC | PRN
  Administered 2017-12-28: 500 mg via INTRAVENOUS
  Filled 2017-12-28: qty 550

## 2017-12-28 MED ORDER — FENTANYL CITRATE (PF) 100 MCG/2ML IJ SOLN
INTRAMUSCULAR | Status: DC | PRN
  Administered 2017-12-28 (×4): 50 ug via INTRAVENOUS
  Administered 2017-12-28: 100 ug via INTRAVENOUS

## 2017-12-28 MED ORDER — DEXAMETHASONE SODIUM PHOSPHATE 10 MG/ML IJ SOLN
10.0000 mg | Freq: Once | INTRAMUSCULAR | Status: AC
  Administered 2017-12-28: 10 mg via INTRAVENOUS

## 2017-12-28 MED ORDER — PROPOFOL 10 MG/ML IV BOLUS
INTRAVENOUS | Status: DC | PRN
  Administered 2017-12-28: 200 mg via INTRAVENOUS
  Administered 2017-12-28: 100 mg via INTRAVENOUS

## 2017-12-28 MED ORDER — METOCLOPRAMIDE HCL 5 MG PO TABS: 5.0000 mg | ORAL_TABLET | Freq: Three times a day (TID) | ORAL | Status: DC | PRN

## 2017-12-28 MED ORDER — HYDROMORPHONE HCL 1 MG/ML IJ SOLN
INTRAMUSCULAR | Status: AC
  Filled 2017-12-28: qty 2

## 2017-12-28 MED ORDER — DOCUSATE SODIUM 100 MG PO CAPS
100.0000 mg | ORAL_CAPSULE | Freq: Two times a day (BID) | ORAL | Status: DC
  Administered 2017-12-28 – 2017-12-29 (×2): 100 mg via ORAL
  Filled 2017-12-28 (×2): qty 1

## 2017-12-28 MED ORDER — EPHEDRINE SULFATE 50 MG/ML IJ SOLN
INTRAMUSCULAR | Status: DC | PRN
  Administered 2017-12-28: 10 mg via INTRAVENOUS
  Administered 2017-12-28 (×3): 15 mg via INTRAVENOUS

## 2017-12-28 MED ORDER — TRAMADOL HCL 50 MG PO TABS: 50.0000 mg | ORAL_TABLET | Freq: Four times a day (QID) | ORAL | Status: DC | PRN

## 2017-12-28 MED ORDER — LIDOCAINE 2% (20 MG/ML) 5 ML SYRINGE
INTRAMUSCULAR | Status: DC | PRN
  Administered 2017-12-28: 60 mg via INTRAVENOUS

## 2017-12-28 MED ORDER — FLEET ENEMA 7-19 GM/118ML RE ENEM
1.0000 | ENEMA | Freq: Once | RECTAL | Status: DC | PRN
Start: 2017-12-28 — End: 2017-12-29

## 2017-12-28 MED ORDER — SODIUM CHLORIDE 0.9 % IV SOLN
INTRAVENOUS | Status: DC
  Administered 2017-12-28: 13:00:00 100 mL/h via INTRAVENOUS

## 2017-12-28 MED ORDER — BUPIVACAINE HCL (PF) 0.25 % IJ SOLN
INTRAMUSCULAR | Status: AC
  Filled 2017-12-28: qty 30

## 2017-12-28 MED ORDER — BISACODYL 10 MG RE SUPP: 10.0000 mg | Freq: Every day | RECTAL | Status: DC | PRN

## 2017-12-28 MED ORDER — MORPHINE SULFATE (PF) 2 MG/ML IV SOLN
1.0000 mg | INTRAVENOUS | Status: DC | PRN
  Administered 2017-12-28: 1 mg via INTRAVENOUS
  Filled 2017-12-28: qty 1

## 2017-12-28 MED ORDER — ACETAMINOPHEN 10 MG/ML IV SOLN: 1000.0000 mg | Freq: Once | INTRAVENOUS | Status: DC | PRN

## 2017-12-28 MED ORDER — MENTHOL 3 MG MT LOZG: 1.0000 | LOZENGE | OROMUCOSAL | Status: DC | PRN

## 2017-12-28 MED ORDER — RIVAROXABAN 10 MG PO TABS
10.0000 mg | ORAL_TABLET | Freq: Every day | ORAL | Status: DC
  Administered 2017-12-29: 09:00:00 10 mg via ORAL
  Filled 2017-12-28: qty 1

## 2017-12-28 MED ORDER — METOCLOPRAMIDE HCL 5 MG/ML IJ SOLN: 5.0000 mg | Freq: Three times a day (TID) | INTRAMUSCULAR | Status: DC | PRN

## 2017-12-28 MED ORDER — EPHEDRINE 5 MG/ML INJ
INTRAVENOUS | Status: AC
  Filled 2017-12-28: qty 10

## 2017-12-28 MED ORDER — SUGAMMADEX SODIUM 200 MG/2ML IV SOLN
INTRAVENOUS | Status: DC | PRN
  Administered 2017-12-28: 200 mg via INTRAVENOUS

## 2017-12-28 MED ORDER — PHENYLEPHRINE 40 MCG/ML (10ML) SYRINGE FOR IV PUSH (FOR BLOOD PRESSURE SUPPORT)
PREFILLED_SYRINGE | INTRAVENOUS | Status: DC | PRN
  Administered 2017-12-28: 200 ug via INTRAVENOUS
  Administered 2017-12-28 (×2): 160 ug via INTRAVENOUS
  Administered 2017-12-28: 80 ug via INTRAVENOUS

## 2017-12-28 MED ORDER — STERILE WATER FOR IRRIGATION IR SOLN
Status: DC | PRN
  Administered 2017-12-28: 2000 mL

## 2017-12-28 MED ORDER — MIDAZOLAM HCL 5 MG/5ML IJ SOLN
INTRAMUSCULAR | Status: DC | PRN
  Administered 2017-12-28: 2 mg via INTRAVENOUS

## 2017-12-28 MED ORDER — PHENYLEPHRINE HCL 10 MG/ML IJ SOLN
INTRAMUSCULAR | Status: DC | PRN
  Administered 2017-12-28: 120 ug via INTRAVENOUS
  Administered 2017-12-28: 160 ug via INTRAVENOUS
  Administered 2017-12-28: 120 ug via INTRAVENOUS

## 2017-12-28 MED ORDER — CEFAZOLIN SODIUM-DEXTROSE 2-4 GM/100ML-% IV SOLN
2.0000 g | INTRAVENOUS | Status: AC
  Administered 2017-12-28: 2 g via INTRAVENOUS
  Filled 2017-12-28: qty 100

## 2017-12-28 MED ORDER — FENTANYL CITRATE (PF) 100 MCG/2ML IJ SOLN
INTRAMUSCULAR | Status: AC
  Filled 2017-12-28: qty 2

## 2017-12-28 MED ORDER — POLYETHYLENE GLYCOL 3350 17 G PO PACK: 17.0000 g | PACK | Freq: Every day | ORAL | Status: DC | PRN

## 2017-12-28 MED ORDER — CHLORHEXIDINE GLUCONATE 4 % EX LIQD: 60.0000 mL | Freq: Once | CUTANEOUS | Status: DC

## 2017-12-28 MED ORDER — ONDANSETRON HCL 4 MG PO TABS: 4.0000 mg | ORAL_TABLET | Freq: Four times a day (QID) | ORAL | Status: DC | PRN

## 2017-12-28 MED ORDER — TRANEXAMIC ACID 1000 MG/10ML IV SOLN
INTRAVENOUS | Status: AC | PRN
  Administered 2017-12-28: 2000 mg via TOPICAL

## 2017-12-28 MED ORDER — DIPHENHYDRAMINE HCL 12.5 MG/5ML PO ELIX: 12.5000 mg | ORAL_SOLUTION | ORAL | Status: DC | PRN

## 2017-12-28 MED ORDER — OXYCODONE HCL 5 MG PO TABS: 5.0000 mg | ORAL_TABLET | ORAL | Status: DC | PRN

## 2017-12-28 MED ORDER — 0.9 % SODIUM CHLORIDE (POUR BTL) OPTIME
TOPICAL | Status: DC | PRN
  Administered 2017-12-28: 1000 mL

## 2017-12-28 MED ORDER — ONDANSETRON HCL 4 MG/2ML IJ SOLN
4.0000 mg | Freq: Four times a day (QID) | INTRAMUSCULAR | Status: DC | PRN
  Administered 2017-12-28: 16:00:00 4 mg via INTRAVENOUS
  Filled 2017-12-28: qty 2

## 2017-12-28 MED ORDER — METHOCARBAMOL 500 MG PO TABS
500.0000 mg | ORAL_TABLET | Freq: Four times a day (QID) | ORAL | Status: DC | PRN
  Administered 2017-12-28 – 2017-12-29 (×3): 500 mg via ORAL
  Filled 2017-12-28 (×3): qty 1

## 2017-12-28 MED ORDER — PHENYLEPHRINE 40 MCG/ML (10ML) SYRINGE FOR IV PUSH (FOR BLOOD PRESSURE SUPPORT)
PREFILLED_SYRINGE | INTRAVENOUS | Status: AC
  Filled 2017-12-28: qty 10

## 2017-12-28 MED ORDER — ACETAMINOPHEN 500 MG PO TABS
1000.0000 mg | ORAL_TABLET | Freq: Four times a day (QID) | ORAL | Status: AC
  Administered 2017-12-28 – 2017-12-29 (×4): 1000 mg via ORAL
  Filled 2017-12-28 (×4): qty 2

## 2017-12-28 MED ORDER — CEFAZOLIN SODIUM-DEXTROSE 2-4 GM/100ML-% IV SOLN
2.0000 g | Freq: Four times a day (QID) | INTRAVENOUS | Status: AC
  Administered 2017-12-28 (×2): 2 g via INTRAVENOUS
  Filled 2017-12-28 (×2): qty 100

## 2017-12-28 MED ORDER — LOSARTAN POTASSIUM 50 MG PO TABS
100.0000 mg | ORAL_TABLET | Freq: Every day | ORAL | Status: DC
  Administered 2017-12-29: 09:00:00 100 mg via ORAL
  Filled 2017-12-28: qty 2

## 2017-12-28 MED ORDER — MIDAZOLAM HCL 2 MG/2ML IJ SOLN
INTRAMUSCULAR | Status: AC
  Filled 2017-12-28: qty 2

## 2017-12-28 MED ORDER — ACETAMINOPHEN 325 MG PO TABS: 650.0000 mg | ORAL_TABLET | ORAL | Status: DC | PRN

## 2017-12-28 MED ORDER — PROMETHAZINE HCL 25 MG/ML IJ SOLN: 6.2500 mg | INTRAMUSCULAR | Status: DC | PRN

## 2017-12-28 MED ORDER — OXYCODONE HCL 5 MG PO TABS
10.0000 mg | ORAL_TABLET | ORAL | Status: DC | PRN
  Administered 2017-12-28 – 2017-12-29 (×6): 10 mg via ORAL
  Filled 2017-12-28 (×6): qty 2

## 2017-12-28 MED ORDER — MEPERIDINE HCL 50 MG/ML IJ SOLN: 6.2500 mg | INTRAMUSCULAR | Status: DC | PRN

## 2017-12-28 MED ORDER — HYDROCODONE-ACETAMINOPHEN 7.5-325 MG PO TABS: 1.0000 | ORAL_TABLET | Freq: Once | ORAL | Status: DC | PRN

## 2017-12-28 MED ORDER — TRANEXAMIC ACID 1000 MG/10ML IV SOLN
2000.0000 mg | Freq: Once | INTRAVENOUS | Status: DC
  Filled 2017-12-28: qty 20

## 2017-12-28 MED ORDER — BUPIVACAINE HCL (PF) 0.25 % IJ SOLN
INTRAMUSCULAR | Status: DC | PRN
  Administered 2017-12-28: 30 mL

## 2017-12-28 MED ORDER — DEXAMETHASONE SODIUM PHOSPHATE 10 MG/ML IJ SOLN
10.0000 mg | Freq: Once | INTRAMUSCULAR | Status: AC
  Administered 2017-12-29: 10 mg via INTRAVENOUS
  Filled 2017-12-28: qty 1

## 2017-12-28 MED ORDER — HYDROMORPHONE HCL 1 MG/ML IJ SOLN
0.2500 mg | INTRAMUSCULAR | Status: DC | PRN
  Administered 2017-12-28 (×4): 0.5 mg via INTRAVENOUS

## 2017-12-28 MED ORDER — LEVOTHYROXINE SODIUM 100 MCG PO TABS
100.0000 ug | ORAL_TABLET | Freq: Every day | ORAL | Status: DC
  Administered 2017-12-29: 100 ug via ORAL
  Filled 2017-12-28: qty 1

## 2017-12-28 SURGICAL SUPPLY — 44 items
BAG DECANTER FOR FLEXI CONT (MISCELLANEOUS) ×3 IMPLANT
BAG ZIPLOCK 12X15 (MISCELLANEOUS) ×3 IMPLANT
BLADE SAG 18X100X1.27 (BLADE) ×3 IMPLANT
CAPT HIP TOTAL 2 ×3 IMPLANT
CLOSURE WOUND 1/2 X4 (GAUZE/BANDAGES/DRESSINGS) ×1
CLOTH BEACON ORANGE TIMEOUT ST (SAFETY) ×3 IMPLANT
COVER PERINEAL POST (MISCELLANEOUS) ×3 IMPLANT
COVER SURGICAL LIGHT HANDLE (MISCELLANEOUS) ×3 IMPLANT
DECANTER SPIKE VIAL GLASS SM (MISCELLANEOUS) ×3 IMPLANT
DRAPE STERI IOBAN 125X83 (DRAPES) ×3 IMPLANT
DRAPE TOP SHEET (DRAPES) ×3 IMPLANT
DRAPE U-SHAPE 47X51 STRL (DRAPES) ×6 IMPLANT
DRSG ADAPTIC 3X8 NADH LF (GAUZE/BANDAGES/DRESSINGS) ×3 IMPLANT
DRSG MEPILEX BORDER 4X4 (GAUZE/BANDAGES/DRESSINGS) ×3 IMPLANT
DRSG MEPILEX BORDER 4X8 (GAUZE/BANDAGES/DRESSINGS) ×3 IMPLANT
DURAPREP 26ML APPLICATOR (WOUND CARE) ×3 IMPLANT
ELECT REM PT RETURN 15FT ADLT (MISCELLANEOUS) ×3 IMPLANT
EVACUATOR 1/8 PVC DRAIN (DRAIN) ×3 IMPLANT
GLOVE BIO SURGEON STRL SZ 6.5 (GLOVE) ×2 IMPLANT
GLOVE BIO SURGEON STRL SZ7.5 (GLOVE) ×3 IMPLANT
GLOVE BIO SURGEON STRL SZ8 (GLOVE) ×6 IMPLANT
GLOVE BIO SURGEONS STRL SZ 6.5 (GLOVE) ×1
GLOVE BIOGEL PI IND STRL 6.5 (GLOVE) ×1 IMPLANT
GLOVE BIOGEL PI IND STRL 7.0 (GLOVE) ×2 IMPLANT
GLOVE BIOGEL PI IND STRL 7.5 (GLOVE) ×3 IMPLANT
GLOVE BIOGEL PI IND STRL 8 (GLOVE) ×2 IMPLANT
GLOVE BIOGEL PI INDICATOR 6.5 (GLOVE) ×2
GLOVE BIOGEL PI INDICATOR 7.0 (GLOVE) ×4
GLOVE BIOGEL PI INDICATOR 7.5 (GLOVE) ×6
GLOVE BIOGEL PI INDICATOR 8 (GLOVE) ×4
GLOVE SURG SS PI 7.0 STRL IVOR (GLOVE) ×3 IMPLANT
GOWN STRL REUS W/TWL LRG LVL3 (GOWN DISPOSABLE) ×6 IMPLANT
GOWN STRL REUS W/TWL XL LVL3 (GOWN DISPOSABLE) ×9 IMPLANT
PACK ANTERIOR HIP CUSTOM (KITS) ×3 IMPLANT
STRIP CLOSURE SKIN 1/2X4 (GAUZE/BANDAGES/DRESSINGS) ×2 IMPLANT
SUT ETHIBOND NAB CT1 #1 30IN (SUTURE) ×3 IMPLANT
SUT MNCRL AB 4-0 PS2 18 (SUTURE) ×3 IMPLANT
SUT STRATAFIX 0 PDS 27 VIOLET (SUTURE) ×3
SUT VIC AB 2-0 CT1 27 (SUTURE) ×4
SUT VIC AB 2-0 CT1 TAPERPNT 27 (SUTURE) ×2 IMPLANT
SUTURE STRATFX 0 PDS 27 VIOLET (SUTURE) ×1 IMPLANT
SYR 50ML LL SCALE MARK (SYRINGE) IMPLANT
TRAY FOLEY W/METER SILVER 16FR (SET/KITS/TRAYS/PACK) ×3 IMPLANT
YANKAUER SUCT BULB TIP 10FT TU (MISCELLANEOUS) ×3 IMPLANT

## 2017-12-28 NOTE — Progress Notes (Signed)
Pt states that he will self administer CPAP when ready for bed.  RT to monitor and assess as needed.  

## 2017-12-28 NOTE — Anesthesia Procedure Notes (Signed)
Procedure Name: Intubation Performed by: Gean Maidens, CRNA Pre-anesthesia Checklist: Patient identified, Emergency Drugs available, Suction available, Patient being monitored and Timeout performed Patient Re-evaluated:Patient Re-evaluated prior to induction Oxygen Delivery Method: Circle system utilized Preoxygenation: Pre-oxygenation with 100% oxygen Induction Type: IV induction Ventilation: Mask ventilation without difficulty Laryngoscope Size: Mac and 4 Grade View: Grade I Tube type: Oral Tube size: 7.5 mm Number of attempts: 1 Airway Equipment and Method: Stylet Placement Confirmation: ETT inserted through vocal cords under direct vision,  positive ETCO2,  CO2 detector and breath sounds checked- equal and bilateral Secured at: 23 cm Tube secured with: Tape Dental Injury: Teeth and Oropharynx as per pre-operative assessment

## 2017-12-28 NOTE — Op Note (Signed)
OPERATIVE REPORT- TOTAL HIP ARTHROPLASTY   PREOPERATIVE DIAGNOSIS: Osteoarthritis of the Left hip.   POSTOPERATIVE DIAGNOSIS: Osteoarthritis of the Left  hip.   PROCEDURE: Left total hip arthroplasty, anterior approach.   SURGEON: Gaynelle Arabian, MD   ASSISTANT: Arlee Muslim, PA-C  ANESTHESIA:  Spinal  ESTIMATED BLOOD LOSS:-800 mL    DRAINS: Hemovac x1.   COMPLICATIONS: None   CONDITION: PACU - hemodynamically stable.   BRIEF CLINICAL NOTE: Dustin Keith is a 72 y.o. male who has rapidly progressive  arthritis of their Left  hip with progressively worsening pain and  dysfunction.The patient has failed nonoperative management and presents for  total hip arthroplasty.   PROCEDURE IN DETAIL: After successful administration of spinal  anesthetic, the traction boots for the [facility] bed were placed on both  feet and the patient was placed onto the [facility] bed, boots placed into the leg  holders. The Left hip was then isolated from the perineum with plastic  drapes and prepped and draped in the usual sterile fashion. ASIS and  greater trochanter were marked and a oblique incision was made, starting  at about 1 cm lateral and 2 cm distal to the ASIS and coursing towards  the anterior cortex of the femur. The skin was cut with a 10 blade  through subcutaneous tissue to the level of the fascia overlying the  tensor fascia lata muscle. The fascia was then incised in line with the  incision at the junction of the anterior third and posterior 2/3rd. The  muscle was teased off the fascia and then the interval between the TFL  and the rectus was developed. The Hohmann retractor was then placed at  the top of the femoral neck over the capsule. The vessels overlying the  capsule were cauterized and the fat on top of the capsule was removed.  A Hohmann retractor was then placed anterior underneath the rectus  femoris to give exposure to the entire anterior capsule. A T-shaped  capsulotomy  was performed. The edges were tagged and the femoral head  was identified.       Osteophytes are removed off the superior acetabulum.  The femoral neck was then cut in situ with an oscillating saw. Traction  was then applied to the left lower extremity utilizing the [facility]  traction. The femoral head was then removed. Retractors were placed  around the acetabulum and then circumferential removal of the labrum was  performed. Osteophytes were also removed. Reaming starts at 49 mm to  medialize and  Increased in 2 mm increments to 53 mm. We reamed in  approximately 40 degrees of abduction, 20 degrees anteversion. A 54 mm  pinnacle acetabular shell was then impacted in anatomic position under  fluoroscopic guidance with excellent purchase. We did not need to place  any additional dome screws. A 36 mm neutral + 4 marathon liner was then  placed into the acetabular shell.       The femoral lift was then placed along the lateral aspect of the femur  just distal to the vastus ridge. The leg was  externally rotated and capsule  was stripped off the inferior aspect of the femoral neck down to the  level of the lesser trochanter, this was done with electrocautery. The femur was lifted after this was performed. The  leg was then placed in an extended and adducted position essentially delivering the femur. We also removed the capsule superiorly and the piriformis from the piriformis fossa to  gain excellent exposure of the  proximal femur. Rongeur was used to remove some cancellous bone to get  into the lateral portion of the proximal femur for placement of the  initial starter reamer. The starter broaches was placed  the starter broach  and was shown to go down the center of the canal. Broaching  with the  Cor[place] system was then performed starting at size 8, coursing  Up to size 12. A size 12 had excellent torsional and rotational  and axial stability. The trial high offset neck was then placed  with a 36  + 1.5 trial head. The hip was then reduced. We confirmed that  the stem was in the canal both on AP and lateral x-rays. It also has excellent sizing. The hip was reduced with outstanding stability through full extension and full external rotation.. AP pelvis was taken and the leg lengths were measured and found to be equal. Hip was then dislocated again and the femoral head and neck removed. The  femoral broach was removed. Size 12 Cor[place] stem with a high offset  neck was then impacted into the femur following native anteversion. Has  excellent purchase in the canal. Excellent torsional and rotational and  axial stability. It is confirmed to be in the canal on AP and lateral  fluoroscopic views. The 36 + 1.5 ceramic head was placed and the hip  reduced with outstanding stability. Again AP pelvis was taken and it  confirmed that the leg lengths were equal. The wound was then copiously  irrigated with saline solution and the capsule reattached and repaired  with Ethibond suture. 30 ml of .25% Bupivicaine was  injected into the capsule and into the edge of the tensor fascia lata as well as subcutaneous tissue. The fascia overlying the tensor fascia lata was then closed with a running #1 V-Loc. Subcu was closed with interrupted 2-0 Vicryl and subcuticular running 4-0 Monocryl. Incision was cleaned  and dried. Steri-Strips and a bulky sterile dressing applied. Hemovac  drain was hooked to suction and then the patient was awakened and transported to  recovery in stable condition.        Please note that a surgical assistant was a medical necessity for this procedure to perform it in a safe and expeditious manner. Assistant was necessary to provide appropriate retraction of vital neurovascular structures and to prevent femoral fracture and allow for anatomic placement of the prosthesis.  Gaynelle Arabian, M.D.

## 2017-12-28 NOTE — Interval H&P Note (Signed)
History and Physical Interval Note:  12/28/2017 7:39 AM  Dustin Keith  has presented today for surgery, with the diagnosis of Osteoarthritis Left Hip  The various methods of treatment have been discussed with the patient and family. After consideration of risks, benefits and other options for treatment, the patient has consented to  Procedure(s): LEFT TOTAL HIP ARTHROPLASTY ANTERIOR APPROACH (Left) as a surgical intervention .  The patient's history has been reviewed, patient examined, no change in status, stable for surgery.  I have reviewed the patient's chart and labs.  Questions were answered to the patient's satisfaction.     Pilar Plate [name]

## 2017-12-28 NOTE — Transfer of Care (Signed)
Immediate Anesthesia Transfer of Care Note  Patient: Dustin Keith  Procedure(s) Performed: LEFT TOTAL HIP ARTHROPLASTY ANTERIOR APPROACH (Left Hip)  Patient Location: PACU  Anesthesia Type:General  Level of Consciousness: sedated, patient cooperative and responds to stimulation  Airway & Oxygen Therapy: Patient Spontanous Breathing and Patient connected to face mask oxygen  Post-op Assessment: Report given to RN and Post -op Vital signs reviewed and stable  Post vital signs: Reviewed and stable  Last Vitals:  Vitals:   12/28/17 0625  BP: 134/85  Pulse: 83  Resp: 18  Temp: 37.1 C  SpO2: 95%    Last Pain:  Vitals:   12/28/17 0646  TempSrc:   PainSc: 7       Patients Stated Pain Goal: 4 (71/69/67 8938)  Complications: No apparent anesthesia complications

## 2017-12-28 NOTE — Anesthesia Postprocedure Evaluation (Signed)
Anesthesia Post Note  Patient: Dustin Keith  Procedure(s) Performed: LEFT TOTAL HIP ARTHROPLASTY ANTERIOR APPROACH (Left Hip)     Patient location during evaluation: PACU Anesthesia Type: General Level of consciousness: awake and alert Pain management: pain level controlled Vital Signs Assessment: post-procedure vital signs reviewed and stable Respiratory status: spontaneous breathing, nonlabored ventilation, respiratory function stable and patient connected to nasal cannula oxygen Cardiovascular status: blood pressure returned to baseline and stable Postop Assessment: no apparent nausea or vomiting Anesthetic complications: no    Last Vitals:  Vitals:   12/28/17 1200 12/28/17 1300  BP: (!) 156/86 (!) 166/66  Pulse: 89 82  Resp: 18 18  Temp: 36.6 C 36.7 C  SpO2: 93% 98%    Last Pain:  Vitals:   12/28/17 1300  TempSrc: Oral  PainSc:                  Barnet Glasgow

## 2017-12-29 LAB — CBC
HCT: 39.4 % (ref 39.0–52.0)
Hemoglobin: 13 g/dL (ref 13.0–17.0)
MCH: 31.3 pg (ref 26.0–34.0)
MCHC: 33 g/dL (ref 30.0–36.0)
MCV: 94.9 fL (ref 78.0–100.0)
Platelets: 202 10*3/uL (ref 150–400)
RBC: 4.15 MIL/uL — ABNORMAL LOW (ref 4.22–5.81)
RDW: 14.4 % (ref 11.5–15.5)
WBC: 15 10*3/uL — ABNORMAL HIGH (ref 4.0–10.5)

## 2017-12-29 LAB — BASIC METABOLIC PANEL
Anion gap: 6 (ref 5–15)
BUN: 17 mg/dL (ref 6–20)
CALCIUM: 8.3 mg/dL — AB (ref 8.9–10.3)
CO2: 25 mmol/L (ref 22–32)
CREATININE: 1.25 mg/dL — AB (ref 0.61–1.24)
Chloride: 105 mmol/L (ref 101–111)
GFR calc non Af Amer: 57 mL/min — ABNORMAL LOW (ref >60)
Glucose, Bld: 128 mg/dL — ABNORMAL HIGH (ref 65–99)
Potassium: 3.7 mmol/L (ref 3.5–5.1)
SODIUM: 136 mmol/L (ref 135–145)

## 2017-12-29 MED ORDER — TRAMADOL HCL 50 MG PO TABS
50.0000 mg | ORAL_TABLET | Freq: Four times a day (QID) | ORAL | 0 refills | Status: AC | PRN
Start: 2017-12-29 — End: ?

## 2017-12-29 MED ORDER — RIVAROXABAN 10 MG PO TABS: 10.0000 mg | ORAL_TABLET | Freq: Every day | ORAL | 0 refills | Status: AC

## 2017-12-29 MED ORDER — METHOCARBAMOL 500 MG PO TABS: 500.0000 mg | ORAL_TABLET | Freq: Four times a day (QID) | ORAL | 0 refills | Status: AC | PRN

## 2017-12-29 MED ORDER — OXYCODONE HCL 5 MG PO TABS: 5.0000 mg | ORAL_TABLET | ORAL | 0 refills | Status: AC | PRN

## 2017-12-29 NOTE — Discharge Instructions (Signed)
° °Dr. Frank [name] °Total Joint Specialist °[facility] °[address] °, [place] [address] °[phone] ° °ANTERIOR APPROACH TOTAL HIP REPLACEMENT POSTOPERATIVE DIRECTIONS ° ° °Hip Rehabilitation, Guidelines Following Surgery  °The results of a hip operation are greatly improved after range of motion and muscle strengthening exercises. Follow all safety measures which are given to protect your hip. If any of these exercises cause increased pain or swelling in your joint, decrease the amount until you are comfortable again. Then slowly increase the exercises. Call your caregiver if you have problems or questions.  ° °HOME CARE INSTRUCTIONS  °Remove items at home which could result in a fall. This includes throw rugs or furniture in walking pathways.  °· ICE to the affected hip every three hours for 30 minutes at a time and then as needed for pain and swelling.  Continue to use ice on the hip for pain and swelling from surgery. You may notice swelling that will progress down to the foot and ankle.  This is normal after surgery.  Elevate the leg when you are not up walking on it.   °· Continue to use the breathing machine which will help keep your temperature down.  It is common for your temperature to cycle up and down following surgery, especially at night when you are not up moving around and exerting yourself.  The breathing machine keeps your lungs expanded and your temperature down. ° ° °DIET °You may resume your previous home diet once your are discharged from the hospital. ° °DRESSING / WOUND CARE / SHOWERING °You may shower 3 days after surgery, but keep the wounds dry during showering.  You may use an occlusive plastic wrap (Press'n Seal for example), NO SOAKING/SUBMERGING IN THE BATHTUB.  If the bandage gets wet, change with a clean dry gauze.  If the incision gets wet, pat the wound dry with a clean towel. °You may start showering once you are discharged home but do not  submerge the incision under water. Just pat the incision dry and apply a dry gauze dressing on daily. °Change the surgical dressing daily and reapply a dry dressing each time. ° °ACTIVITY °Walk with your walker as instructed. °Use walker as long as suggested by your caregivers. °Avoid periods of inactivity such as sitting longer than an hour when not asleep. This helps prevent blood clots.  °You may resume a sexual relationship in one month or when given the OK by your doctor.  °You may return to work once you are cleared by your doctor.  °Do not drive a car for 6 weeks or until released by you surgeon.  °Do not drive while taking narcotics. ° °WEIGHT BEARING °Weight bearing as tolerated with assist device (walker, cane, etc) as directed, use it as long as suggested by your surgeon or therapist, typically at least 4-6 weeks. ° °POSTOPERATIVE CONSTIPATION PROTOCOL °Constipation - defined medically as fewer than three stools per week and severe constipation as less than one stool per week. ° °One of the most common issues patients have following surgery is constipation.  Even if you have a regular bowel pattern at home, your normal regimen is likely to be disrupted due to multiple reasons following surgery.  Combination of anesthesia, postoperative narcotics, change in appetite and fluid intake all can affect your bowels.  In order to avoid complications following surgery, here are some recommendations in order to help you during your recovery period. ° °Colace (docusate) - Pick up an over-the-counter   form of Colace or another stool softener and take twice a day as long as you are requiring postoperative pain medications.  Take with a full glass of water daily.  If you experience loose stools or diarrhea, hold the colace until you stool forms back up.  If your symptoms do not get better within 1 week or if they get worse, check with your doctor. ° °Dulcolax (bisacodyl) - Pick up over-the-counter and take as directed  by the product packaging as needed to assist with the movement of your bowels.  Take with a full glass of water.  Use this product as needed if not relieved by Colace only.  ° °MiraLax (polyethylene glycol) - Pick up over-the-counter to have on hand.  MiraLax is a solution that will increase the amount of water in your bowels to assist with bowel movements.  Take as directed and can mix with a glass of water, juice, soda, coffee, or tea.  Take if you go more than two days without a movement. °Do not use MiraLax more than once per day. Call your doctor if you are still constipated or irregular after using this medication for 7 days in a row. ° °If you continue to have problems with postoperative constipation, please contact the office for further assistance and recommendations.  If you experience "the worst abdominal pain ever" or develop nausea or vomiting, please contact the office immediatly for further recommendations for treatment. ° °ITCHING ° If you experience itching with your medications, try taking only a single pain pill, or even half a pain pill at a time.  You can also use Benadryl over the counter for itching or also to help with sleep.  ° °TED HOSE STOCKINGS °Wear the elastic stockings on both legs for three weeks following surgery during the day but you may remove then at night for sleeping. ° °MEDICATIONS °See your medication summary on the “After Visit Summary” that the nursing staff will review with you prior to discharge.  You may have some home medications which will be placed on hold until you complete the course of blood thinner medication.  It is important for you to complete the blood thinner medication as prescribed by your surgeon.  Continue your approved medications as instructed at time of discharge. ° °PRECAUTIONS °If you experience chest pain or shortness of breath - call 911 immediately for transfer to the hospital emergency department.  °If you develop a fever greater that 101 F,  purulent drainage from wound, increased redness or drainage from wound, foul odor from the wound/dressing, or calf pain - CONTACT YOUR SURGEON.   °                                                °FOLLOW-UP APPOINTMENTS °Make sure you keep all of your appointments after your operation with your surgeon and caregivers. You should call the office at the above phone number and make an appointment for approximately two weeks after the date of your surgery or on the date instructed by your surgeon outlined in the "After Visit Summary". ° °RANGE OF MOTION AND STRENGTHENING EXERCISES  °These exercises are designed to help you keep full movement of your hip joint. Follow your caregiver's or physical therapist's instructions. Perform all exercises about fifteen times, three times per day or as directed. Exercise both hips, even if you   have had only one joint replacement. These exercises can be done on a training (exercise) mat, on the floor, on a table or on a bed. Use whatever works the best and is most comfortable for you. Use music or television while you are exercising so that the exercises are a pleasant break in your day. This will make your life better with the exercises acting as a break in routine you can look forward to.  Lying on your back, slowly slide your foot toward your buttocks, raising your knee up off the floor. Then slowly slide your foot back down until your leg is straight again.  Lying on your back spread your legs as far apart as you can without causing discomfort.  Lying on your side, raise your upper leg and foot straight up from the floor as far as is comfortable. Slowly lower the leg and repeat.  Lying on your back, tighten up the muscle in the front of your thigh (quadriceps muscles). You can do this by keeping your leg straight and trying to raise your heel off the floor. This helps strengthen the largest muscle supporting your knee.  Lying on your back, tighten up the muscles of your  buttocks both with the legs straight and with the knee bent at a comfortable angle while keeping your heel on the floor.   IF YOU ARE TRANSFERRED TO A SKILLED REHAB FACILITY If the patient is transferred to a skilled rehab facility following release from the hospital, a list of the current medications will be sent to the facility for the patient to continue.  When discharged from the skilled rehab facility, please have the facility set up the patient's [place] prior to being released. Also, the skilled facility will be responsible for providing the patient with their medications at time of release from the facility to include their pain medication, the muscle relaxants, and their blood thinner medication. If the patient is still at the rehab facility at time of the two week follow up appointment, the skilled rehab facility will also need to assist the patient in arranging follow up appointment in our office and any transportation needs.  MAKE SURE YOU:  Understand these instructions.  Get help right away if you are not doing well or get worse.    Pick up stool softner and laxative for home use following surgery while on pain medications. Do not submerge incision under water. Please use good hand washing techniques while changing dressing each day. May shower starting three days after surgery. Please use a clean towel to pat the incision dry following showers. Continue to use ice for pain and swelling after surgery. Do not use any lotions or creams on the incision until instructed by your surgeon.  Take Xarelto for two and a half more weeks following discharge from the hospital, then discontinue Xarelto. Once the patient has completed the Xarelto, they may resume the 81 mg Aspirin.      Information on my medicine - XARELTO (Rivaroxaban)  Why was Xarelto prescribed for you? Xarelto was prescribed for you to reduce the risk of blood clots forming after orthopedic  surgery. The medical term for these abnormal blood clots is venous thromboembolism (VTE).  What do you need to know about xarelto ? Take your Xarelto ONCE DAILY at the same time every day. You may take it either with or without food.  If you have difficulty swallowing the tablet whole, you may crush it and mix in applesauce  just prior to taking your dose.  Take Xarelto exactly as prescribed by your doctor and DO NOT stop taking Xarelto without talking to the doctor who prescribed the medication.  Stopping without other VTE prevention medication to take the place of Xarelto may increase your risk of developing a clot.  After discharge, you should have regular check-up appointments with your healthcare provider that is prescribing your Xarelto.    What do you do if you miss a dose? If you miss a dose, take it as soon as you remember on the same day then continue your regularly scheduled once daily regimen the next day. Do not take two doses of Xarelto on the same day.   Important Safety Information A possible side effect of Xarelto is bleeding. You should call your healthcare provider right away if you experience any of the following: ? Bleeding from an injury or your nose that does not stop. ? Unusual colored urine (red or dark brown) or unusual colored stools (red or black). ? Unusual bruising for unknown reasons. ? A serious fall or if you hit your head (even if there is no bleeding).  Some medicines may interact with Xarelto and might increase your risk of bleeding while on Xarelto. To help avoid this, consult your healthcare provider or pharmacist prior to using any new prescription or non-prescription medications, including herbals, vitamins, non-steroidal anti-inflammatory drugs (NSAIDs) and supplements.  This website has more information on Xarelto: [email or web address]

## 2017-12-29 NOTE — Discharge Summary (Signed)
Physician Discharge Summary   Patient ID: Dustin Keith MRN: [id number] DOB/AGE: 1946/05/20 72 y.o.  Admit date: 12/28/2017 Discharge date: 12-29-2017  Primary Diagnosis:   Admission Diagnoses:  Past Medical History:  Diagnosis Date  . Arthritis   . Cancer (HCC)    DUODENAL   . Hypertension   . Hypothyroidism   . Sleep apnea    CPAP    Discharge Diagnoses:   Principal Problem:   OA (osteoarthritis) of hip  Estimated body mass index is 35.58 kg/m as calculated from the following:   Height as of this encounter: 5' 10"  (1.778 m).   Weight as of this encounter: 112.5 kg (248 lb).  Procedure(s) (LRB): LEFT TOTAL HIP ARTHROPLASTY ANTERIOR APPROACH (Left)   Consults: None  HPI: Dustin Keith is a 72 y.o. male who has rapidly progressive  arthritis of their Left  hip with progressively worsening pain and  dysfunction.The patient has failed nonoperative management and presents for  total hip arthroplasty.    Laboratory Data: Admission on 12/28/2017  Component Date Value Ref Range Status  . WBC 12/29/2017 15.0* 4.0 - 10.5 K/uL Final  . RBC 12/29/2017 4.15* 4.22 - 5.81 MIL/uL Final  . Hemoglobin 12/29/2017 13.0  13.0 - 17.0 g/dL Final  . HCT 12/29/2017 39.4  39.0 - 52.0 % Final  . MCV 12/29/2017 94.9  78.0 - 100.0 fL Final  . MCH 12/29/2017 31.3  26.0 - 34.0 pg Final  . MCHC 12/29/2017 33.0  30.0 - 36.0 g/dL Final  . RDW 12/29/2017 14.4  11.5 - 15.5 % Final  . Platelets 12/29/2017 202  150 - 400 K/uL Final  . Sodium 12/29/2017 136  135 - 145 mmol/L Final  . Potassium 12/29/2017 3.7  3.5 - 5.1 mmol/L Final  . Chloride 12/29/2017 105  101 - 111 mmol/L Final  . CO2 12/29/2017 25  22 - 32 mmol/L Final  . Glucose, Bld 12/29/2017 128* 65 - 99 mg/dL Final  . BUN 12/29/2017 17  6 - 20 mg/dL Final  . Creatinine, Ser 12/29/2017 1.25* 0.61 - 1.24 mg/dL Final  . Calcium 12/29/2017 8.3* 8.9 - 10.3 mg/dL Final  . GFR calc non Af Amer 12/29/2017 57* >60 mL/min Final  . GFR calc Af Amer  12/29/2017 >60  >60 mL/min Final   Comment: (NOTE) The eGFR has been calculated using the CKD EPI equation. This calculation has not been validated in all clinical situations. eGFR's persistently <60 mL/min signify possible Chronic Kidney Disease.   Georgiann Hahn gap 12/29/2017 6  5 - 15 Final  Hospital Outpatient Visit on 12/23/2017  Component Date Value Ref Range Status  . aPTT 12/23/2017 29  24 - 36 seconds Final  . WBC 12/23/2017 7.2  4.0 - 10.5 K/uL Final  . RBC 12/23/2017 4.93  4.22 - 5.81 MIL/uL Final  . Hemoglobin 12/23/2017 15.7  13.0 - 17.0 g/dL Final  . HCT 12/23/2017 46.2  39.0 - 52.0 % Final  . MCV 12/23/2017 93.7  78.0 - 100.0 fL Final  . MCH 12/23/2017 31.8  26.0 - 34.0 pg Final  . MCHC 12/23/2017 34.0  30.0 - 36.0 g/dL Final  . RDW 12/23/2017 13.9  11.5 - 15.5 % Final  . Platelets 12/23/2017 202  150 - 400 K/uL Final  . Sodium 12/23/2017 136  135 - 145 mmol/L Final  . Potassium 12/23/2017 4.1  3.5 - 5.1 mmol/L Final  . Chloride 12/23/2017 105  101 - 111 mmol/L Final  . CO2 12/23/2017 24  22 -  32 mmol/L Final  . Glucose, Bld 12/23/2017 97  65 - 99 mg/dL Final  . BUN 12/23/2017 20  6 - 20 mg/dL Final  . Creatinine, Ser 12/23/2017 1.10  0.61 - 1.24 mg/dL Final  . Calcium 12/23/2017 8.9  8.9 - 10.3 mg/dL Final  . Total Protein 12/23/2017 7.0  6.5 - 8.1 g/dL Final  . Albumin 12/23/2017 4.0  3.5 - 5.0 g/dL Final  . AST 12/23/2017 26  15 - 41 U/L Final  . ALT 12/23/2017 24  17 - 63 U/L Final  . Alkaline Phosphatase 12/23/2017 48  38 - 126 U/L Final  . Total Bilirubin 12/23/2017 0.6  0.3 - 1.2 mg/dL Final  . GFR calc non Af Amer 12/23/2017 >60  >60 mL/min Final  . GFR calc Af Amer 12/23/2017 >60  >60 mL/min Final   Comment: (NOTE) The eGFR has been calculated using the CKD EPI equation. This calculation has not been validated in all clinical situations. eGFR's persistently <60 mL/min signify possible Chronic Kidney Disease.   . Anion gap 12/23/2017 7  5 - 15 Final  .  Prothrombin Time 12/23/2017 13.2  11.4 - 15.2 seconds Final  . INR 12/23/2017 1.01   Final  . ABO/RH(D) 12/23/2017 O POS   Final  . Antibody Screen 12/23/2017 NEG   Final  . Sample Expiration 12/23/2017 12/31/2017   Final  . Extend sample reason 12/23/2017 NO TRANSFUSIONS OR PREGNANCY IN THE PAST 3 MONTHS   Final  . MRSA, PCR 12/23/2017 NEGATIVE  NEGATIVE Final  . Staphylococcus aureus 12/23/2017 POSITIVE* NEGATIVE Final   Comment: (NOTE) The Xpert SA Assay (FDA approved for NASAL specimens in patients 53 years of age and older), is one component of a comprehensive surveillance program. It is not intended to diagnose infection nor to guide or monitor treatment.   . ABO/RH(D) 12/23/2017 O POS   Final     X-Rays:Dg Pelvis Portable  Result Date: 12/28/2017 CLINICAL DATA:  Left hip replacement EXAM: PORTABLE PELVIS 1-2 VIEWS COMPARISON:  12/28/2017 FINDINGS: Left hip replacement in satisfactory position and alignment. No fracture or complication IMPRESSION: Satisfactory left hip replacement Electronically Signed   By: Franchot Gallo M.D.   On: 12/28/2017 11:10   Dg C-arm 1-60 Min-no Report  Result Date: 12/28/2017 Fluoroscopy was utilized by the requesting physician.  No radiographic interpretation.    EKG: Orders placed or performed during the hospital encounter of 12/23/17  . EKG 12-Lead  . EKG 12-Lead     Hospital Course: Patient was admitted to [facility] and taken to the OR and underwent the above state procedure without complications.  Patient tolerated the procedure well and was later transferred to the recovery room and then to the orthopaedic floor for postoperative care.  They were given PO and IV analgesics for pain control following their surgery.  They were given 24 hours of postoperative antibiotics of  Anti-infectives (From admission, onward)   Start     Dose/Rate Route Frequency Ordered Stop   12/28/17 1400  ceFAZolin (ANCEF) IVPB 2g/100 mL premix     2 g 200  mL/hr over 30 Minutes Intravenous Every 6 hours 12/28/17 1227 12/28/17 2247   12/28/17 0620  ceFAZolin (ANCEF) IVPB 2g/100 mL premix     2 g 200 mL/hr over 30 Minutes Intravenous On call to O.R. 12/28/17 0350 12/28/17 0925     and started on DVT prophylaxis in the form of Xarelto.   PT and OT were ordered for total hip  protocol.  The patient was allowed to be WBAT with therapy. Discharge planning was consulted to help with postop disposition and equipment needs.  Patient had a good night on the evening of surgery.  They started to get up OOB with therapy on day one.  Hemovac drain was pulled without difficulty.  Dressing was clean and dry. Patient was seen in rounds on day one and was ready to go home following two sessions of therapy.  Diet - Cardiac diet Follow up - in 2 weeks Activity - WBAT Disposition - Home Condition Upon Discharge - Stable D/C Meds - See DC Summary DVT Prophylaxis - Xarelto     Discharge Instructions    Call MD / Call 911   Complete by:  As directed    If you experience chest pain or shortness of breath, CALL 911 and be transported to the hospital emergency room.  If you develope a fever above 101 F, pus (white drainage) or increased drainage or redness at the wound, or calf pain, call your surgeon's office.   Change dressing   Complete by:  As directed    You may change your dressing dressing daily with sterile 4 x 4 inch gauze dressing and paper tape.  Do not submerge the incision under water.   Constipation Prevention   Complete by:  As directed    Drink plenty of fluids.  Prune juice may be helpful.  You may use a stool softener, such as Colace (over the counter) 100 mg twice a day.  Use MiraLax (over the counter) for constipation as needed.   Diet - low sodium heart healthy   Complete by:  As directed    Discharge instructions   Complete by:  As directed    Take Xarelto for two and a half more weeks, then discontinue Xarelto. Once the patient has  completed the Xarelto, they may resume the 81 mg Aspirin.   Pick up stool softner and laxative for home use following surgery while on pain medications. Do not submerge incision under water. Please use good hand washing techniques while changing dressing each day. May shower starting three days after surgery. Please use a clean towel to pat the incision dry following showers. Continue to use ice for pain and swelling after surgery. Do not use any lotions or creams on the incision until instructed by your surgeon.  Wear both TED hose on both legs during the day every day for three weeks, but may remove the TED hose at night at home.  Postoperative Constipation Protocol  Constipation - defined medically as fewer than three stools per week and severe constipation as less than one stool per week.  One of the most common issues patients have following surgery is constipation.  Even if you have a regular bowel pattern at home, your normal regimen is likely to be disrupted due to multiple reasons following surgery.  Combination of anesthesia, postoperative narcotics, change in appetite and fluid intake all can affect your bowels.  In order to avoid complications following surgery, here are some recommendations in order to help you during your recovery period.  Colace (docusate) - Pick up an over-the-counter form of Colace or another stool softener and take twice a day as long as you are requiring postoperative pain medications.  Take with a full glass of water daily.  If you experience loose stools or diarrhea, hold the colace until you stool forms back up.  If your symptoms do not get better within 1  week or if they get worse, check with your doctor.  Dulcolax (bisacodyl) - Pick up over-the-counter and take as directed by the product packaging as needed to assist with the movement of your bowels.  Take with a full glass of water.  Use this product as needed if not relieved by Colace only.   MiraLax  (polyethylene glycol) - Pick up over-the-counter to have on hand.  MiraLax is a solution that will increase the amount of water in your bowels to assist with bowel movements.  Take as directed and can mix with a glass of water, juice, soda, coffee, or tea.  Take if you go more than two days without a movement. Do not use MiraLax more than once per day. Call your doctor if you are still constipated or irregular after using this medication for 7 days in a row.  If you continue to have problems with postoperative constipation, please contact the office for further assistance and recommendations.  If you experience "the worst abdominal pain ever" or develop nausea or vomiting, please contact the office immediatly for further recommendations for treatment.   Do not sit on low chairs, stoools or toilet seats, as it may be difficult to get up from low surfaces   Complete by:  As directed    Driving restrictions   Complete by:  As directed    No driving until released by the physician.   Increase activity slowly as tolerated   Complete by:  As directed    Lifting restrictions   Complete by:  As directed    No lifting until released by the physician.   Patient may shower   Complete by:  As directed    You may shower without a dressing once there is no drainage.  Do not wash over the wound.  If drainage remains, do not shower until drainage stops.   TED hose   Complete by:  As directed    Use stockings (TED hose) for 3 weeks on both leg(s).  You may remove them at night for sleeping.   Weight bearing as tolerated   Complete by:  As directed    Laterality:  left   Extremity:  Lower     Allergies as of 12/29/2017      Reactions   Gabapentin Other (See Comments)   Temporary Amnesia   Atenolol Nausea Only, Other (See Comments)   Muscle and joint pain      Medication List    STOP taking these medications   ascorbic acid 1000 MG tablet Commonly known as:  VITAMIN C   aspirin EC 81 MG tablet     ibuprofen 200 MG tablet Commonly known as:  ADVIL,MOTRIN   MENS MULTIVITAMIN PLUS PO   OVER THE COUNTER MEDICATION   oxyCODONE-acetaminophen 5-325 MG tablet Commonly known as:  PERCOCET/ROXICET   testosterone cypionate 200 MG/ML injection Commonly known as:  DEPOTESTOSTERONE CYPIONATE     TAKE these medications   levothyroxine 100 MCG tablet Commonly known as:  SYNTHROID, LEVOTHROID Take 100 mcg by mouth daily before breakfast.   losartan 100 MG tablet Commonly known as:  COZAAR Take 100 mg by mouth daily.   methocarbamol 500 MG tablet Commonly known as:  ROBAXIN Take 1 tablet (500 mg total) by mouth every 6 (six) hours as needed for muscle spasms.   niacin 100 MG tablet Take 100 mg by mouth at bedtime.   oxyCODONE 5 MG immediate release tablet Commonly known as:  Oxy IR/ROXICODONE Take 1-2  tablets (5-10 mg total) by mouth every 4 (four) hours as needed for moderate pain or severe pain.   rivaroxaban 10 MG Tabs tablet Commonly known as:  XARELTO Take 1 tablet (10 mg total) by mouth daily with breakfast. Take Xarelto for two and a half more weeks following discharge from the hospital, then discontinue Xarelto. Once the patient has completed the Xarelto, they may resume the 81 mg Aspirin.   SYSTANE OP Place 1 drop into both eyes daily as needed (for dry eyes).   traMADol 50 MG tablet Commonly known as:  ULTRAM Take 1-2 tablets (50-100 mg total) by mouth every 6 (six) hours as needed (mild pain).            Discharge Care Instructions  (From admission, onward)        Start     Ordered   12/29/17 0000  Weight bearing as tolerated    Question Answer Comment  Laterality left   Extremity Lower      12/29/17 0814   12/29/17 0000  Change dressing    Comments:  You may change your dressing dressing daily with sterile 4 x 4 inch gauze dressing and paper tape.  Do not submerge the incision under water.   12/29/17 4114     Follow-up Information    Gaynelle Arabian, MD. Schedule an appointment as soon as possible for a visit on 01/10/2018.   Specialty:  Orthopedic Surgery Contact information: [address] [place] [address] [phone]           Signed: Arlee Muslim, PA-C Orthopaedic Surgery 12/29/2017, 8:17 AM

## 2017-12-29 NOTE — Progress Notes (Signed)
Discharge planning, spoke with patient and spouse at bedside. [facility] chosen [facility], evaluate and treat. Wife had PT recently and would like the same therapist. [facility] for referral, requested therapist. Has [place] and [place]. [phone]

## 2017-12-29 NOTE — Progress Notes (Signed)
   Subjective: 1 Day Post-Op Procedure(s) (LRB): LEFT TOTAL HIP ARTHROPLASTY ANTERIOR APPROACH (Left) Patient reports pain as moderate and severe. Pain is better this morning   Patient seen in rounds with Dr. Wynelle Link. Patient is well, but has had some minor complaints of pain in the hip, requiring pain medications We will start therapy today.  If they do well with therapy and meets all goals, then will allow home later this afternoon following therapy. Plan is to go Home after hospital stay.  Objective: Vital signs in last 24 hours: Temp:  [97.9 F (36.6 C)-98.6 F (37 C)] 98.2 F (36.8 C) (01/10 0508) Pulse Rate:  [79-99] 86 (01/10 0508) Resp:  [13-25] 16 (01/10 0508) BP: (132-168)/(66-110) 132/85 (01/10 0508) SpO2:  [93 %-100 %] 99 % (01/10 0508) Weight:  [112.5 kg (248 lb)] 112.5 kg (248 lb) (01/09 1200)  Intake/Output from previous day:  Intake/Output Summary (Last 24 hours) at 12/29/2017 0801 Last data filed at 12/29/2017 0738 Gross per 24 hour  Intake 3800 ml  Output 2505 ml  Net 1295 ml    Intake/Output this shift: No intake/output data recorded.  Labs: Recent Labs    12/29/17 0551  HGB 13.0   Recent Labs    12/29/17 0551  WBC 15.0*  RBC 4.15*  HCT 39.4  PLT 202   Recent Labs    12/29/17 0551  NA 136  K 3.7  CL 105  CO2 25  BUN 17  CREATININE 1.25*  GLUCOSE 128*  CALCIUM 8.3*   No results for input(s): LABPT, INR in the last 72 hours.  EXAM General - Patient is Alert, Appropriate and Oriented Extremity - Neurovascular intact Sensation intact distally Dorsiflexion/Plantar flexion intact Dressing - dressing C/D/I Motor Function - intact, moving foot and toes well on exam.  Hemovac pulled without difficulty.  Past Medical History:  Diagnosis Date  . Arthritis   . Cancer (HCC)    DUODENAL   . Hypertension   . Hypothyroidism   . Sleep apnea    CPAP     Assessment/Plan: 1 Day Post-Op Procedure(s) (LRB): LEFT TOTAL HIP ARTHROPLASTY  ANTERIOR APPROACH (Left) Principal Problem:   OA (osteoarthritis) of hip  Estimated body mass index is 35.58 kg/m as calculated from the following:   Height as of this encounter: 5\' 10"  (1.778 m).   Weight as of this encounter: 112.5 kg (248 lb). Up with therapy  DVT Prophylaxis - Xarelto Weight Bearing As Tolerated left Leg Hemovac Pulled Begin Therapy  If meets goals and able to go home: Up with therapy Diet - Cardiac diet Follow up - in 2 weeks Activity - WBAT Disposition - Home Condition Upon Discharge - Stable D/C Meds - See DC Summary DVT Prophylaxis - Xarelto  Arlee Muslim, PA-C Orthopaedic Surgery 12/29/2017, 8:01 AM

## 2017-12-29 NOTE — Evaluation (Signed)
Physical Therapy Evaluation Patient Details Name: Dustin Keith MRN: [id number] DOB: 10-24-1946 Today's Date: 12/29/2017   History of Present Illness  Pt s/p L THR and with hx of multiple back surgery  Clinical Impression  Pt s/p L THR and presents with decreased L LE strength/ROM and post op pain limiting functional mobility.  Pt should progress to dc home with family assist and HHPT follow up.    Follow Up Recommendations Home health PT    Equipment Recommendations  None recommended by PT    Recommendations for Other Services       Precautions / Restrictions Precautions Precautions: Fall Restrictions Weight Bearing Restrictions: No Other Position/Activity Restrictions: WBAT      Mobility  Bed Mobility Overal bed mobility: Needs Assistance Bed Mobility: Supine to Sit;Sit to Supine     Supine to sit: Min assist Sit to supine: Min assist   General bed mobility comments: cues for sequence and use of R LE to self assist  Transfers Overall transfer level: Needs assistance Equipment used: Rolling walker (2 wheeled) Transfers: Sit to/from Stand Sit to Stand: Min assist;Min guard         General transfer comment: cues for LE management and use of UEs to self assist  Ambulation/Gait Ambulation/Gait assistance: Min assist;Min guard Ambulation Distance (Feet): 150 Feet Assistive device: Rolling walker (2 wheeled) Gait Pattern/deviations: Step-to pattern;Step-through pattern;Decreased step length - right;Decreased step length - left;Shuffle;Trunk flexed Gait velocity: decr Gait velocity interpretation: Below normal speed for age/gender General Gait Details: cues for posture, position from RW and initial sequence  Stairs            Wheelchair Mobility    Modified Rankin (Stroke Patients Only)       Balance                                             Pertinent Vitals/Pain Pain Assessment: 0-10 Pain Score: 3  Pain Location: L  hip Pain Descriptors / Indicators: Aching;Sore Pain Intervention(s): Limited activity within patient's tolerance;Monitored during session;Premedicated before session;Ice applied    Home Living Family/patient expects to be discharged to:: Private residence Living Arrangements: Spouse/significant other Available Help at Discharge: Family Type of Home: House Home Access: Ramped entrance     [place]: One [place]: Environmental consultant - 2 wheels Additional Comments: Pt with all equipment from previous surgery and wife's prior bilat THRs    Prior Function Level of Independence: Independent               Hand Dominance        Extremity/Trunk Assessment   Upper Extremity Assessment Upper Extremity Assessment: Overall WFL for tasks assessed    Lower Extremity Assessment Lower Extremity Assessment: LLE deficits/detail LLE Deficits / Details: Strength at hip 2+/5 with AAROM at hip to 85 flex and 15 abd       Communication   Communication: No difficulties  Cognition Arousal/Alertness: Awake/alert Behavior During Therapy: WFL for tasks assessed/performed Overall Cognitive Status: Within Functional Limits for tasks assessed                                        General Comments      Exercises Total Joint Exercises Ankle Circles/Pumps: AROM;Both;15 reps;Supine Quad Sets: AROM;Both;10 reps;Supine Heel Slides: AAROM;Left;20  reps;Supine Hip ABduction/ADduction: AAROM;Left;15 reps;Supine   Assessment/Plan    PT Assessment Patient needs continued PT services  PT Problem List Decreased strength;Decreased range of motion;Decreased activity tolerance;Decreased mobility;Decreased knowledge of use of DME;Pain       PT Treatment Interventions DME instruction;Gait training;Functional mobility training;Therapeutic activities;Therapeutic exercise;Patient/family education    PT Goals (Current goals can be found in the Care Plan section)  Acute Rehab PT  Goals Patient Stated Goal: Regain IND PT Goal Formulation: With patient Time For Goal Achievement: 12/30/17 Potential to Achieve Goals: Good    Frequency 7X/week   Barriers to discharge        Co-evaluation               AM-PAC PT "6 Clicks" Daily Activity  Outcome Measure Difficulty turning over in bed (including adjusting bedclothes, sheets and blankets)?: Unable Difficulty moving from lying on back to sitting on the side of the bed? : Unable Difficulty sitting down on and standing up from a chair with arms (e.g., wheelchair, bedside commode, etc,.)?: Unable Help needed moving to and from a bed to chair (including a wheelchair)?: A Little Help needed walking in hospital room?: A Little Help needed climbing 3-5 steps with a railing? : A Little 6 Click Score: 12    End of Session Equipment Utilized During Treatment: Gait belt Activity Tolerance: Patient tolerated treatment well Patient left: in chair;with call bell/phone within reach;with family/visitor present Nurse Communication: Mobility status PT Visit Diagnosis: Difficulty in walking, not elsewhere classified (R26.2)    Time: 1020-1050 PT Time Calculation (min) (ACUTE ONLY): 30 min   Charges:   PT Evaluation $PT Eval Low Complexity: 1 Low PT Treatments $Therapeutic Exercise: 8-22 mins   PT G Codes:        Pg [phone]   Rion Catala 12/29/2017, 1:35 PM

## 2017-12-29 NOTE — Progress Notes (Signed)
Physical Therapy Treatment Patient Details Name: Dustin Keith MRN: [id number] DOB: 1946/09/11 Today's Date: 12/29/2017    History of Present Illness Pt s/p L THR and with hx of multiple back surgery    PT Comments    Pt progressing well with mobility and with good pain control.  Reviewed car transfers and single step with spouse present.   Follow Up Recommendations  Home health PT     Equipment Recommendations  None recommended by PT    Recommendations for Other Services       Precautions / Restrictions Precautions Precautions: Fall Restrictions Weight Bearing Restrictions: No Other Position/Activity Restrictions: WBAT    Mobility  Bed Mobility Overal bed mobility: Needs Assistance Bed Mobility: Supine to Sit;Sit to Supine     Supine to sit: Min assist Sit to supine: Min assist   General bed mobility comments: cues for sequence and use of R LE to self assist  Transfers Overall transfer level: Needs assistance Equipment used: Rolling walker (2 wheeled) Transfers: Sit to/from Stand Sit to Stand: Min guard;Supervision         General transfer comment: min cues for LE management and use of UEs to self assist  Ambulation/Gait Ambulation/Gait assistance: Min guard;Supervision Ambulation Distance (Feet): 240 Feet Assistive device: Rolling walker (2 wheeled) Gait Pattern/deviations: Step-to pattern;Step-through pattern;Decreased step length - right;Decreased step length - left;Shuffle;Trunk flexed Gait velocity: decr Gait velocity interpretation: Below normal speed for age/gender General Gait Details: min cues for posture, position from RW and initial sequence   Stairs Stairs: (P) Yes   Stair Management: (P) No rails;Backwards;With walker;Step to pattern Number of Stairs: (P) 2 General stair comments: (P) single step twice bkwd to simiulate running board.  Cues for sequence and foot placement  Wheelchair Mobility    Modified Rankin (Stroke Patients  Only)       Balance                                            Cognition Arousal/Alertness: Awake/alert Behavior During Therapy: WFL for tasks assessed/performed Overall Cognitive Status: Within Functional Limits for tasks assessed                                        Exercises Total Joint Exercises Ankle Circles/Pumps: AROM;Both;15 reps;Supine Quad Sets: AROM;Both;10 reps;Supine Heel Slides: AAROM;Left;20 reps;Supine Hip ABduction/ADduction: AAROM;Left;15 reps;Supine    General Comments        Pertinent Vitals/Pain Pain Assessment: 0-10 Pain Score: 2  Pain Location: L hip Pain Descriptors / Indicators: Aching;Sore Pain Intervention(s): Limited activity within patient's tolerance;Monitored during session;Premedicated before session    [name] expects to be discharged to:: Private residence Living Arrangements: Spouse/significant other Available Help at Discharge: Family Type of Home: House Home Access: Ramped entrance   [name]: One [name]: Environmental consultant - 2 wheels Additional Comments: Pt with all equipment from previous surgery and wife's prior bilat THRs    Prior Function Level of Independence: Independent          PT Goals (current goals can now be found in the care plan section) Acute Rehab PT Goals Patient Stated Goal: Regain IND PT Goal Formulation: With patient Time For Goal Achievement: 12/30/17 Potential to Achieve Goals: Good Progress towards PT goals: Progressing toward goals  Frequency    7X/week      PT Plan Current plan remains appropriate    Co-evaluation              AM-PAC PT "6 Clicks" Daily Activity  Outcome Measure  Difficulty turning over in bed (including adjusting bedclothes, sheets and blankets)?: Unable Difficulty moving from lying on back to sitting on the side of the bed? : Unable Difficulty sitting down on and standing up from a chair with arms  (e.g., wheelchair, bedside commode, etc,.)?: A Little Help needed moving to and from a bed to chair (including a wheelchair)?: A Little Help needed walking in hospital room?: A Little Help needed climbing 3-5 steps with a railing? : A Little 6 Click Score: 14    End of Session Equipment Utilized During Treatment: Gait belt Activity Tolerance: Patient tolerated treatment well Patient left: in chair;with call bell/phone within reach;with family/visitor present Nurse Communication: Mobility status PT Visit Diagnosis: Difficulty in walking, not elsewhere classified (R26.2)     Time: 1250-(P) 1314 PT Time Calculation (min) (ACUTE ONLY): (P) 24 min  Charges:  $Gait Training: 8-22 mins $Therapeutic Exercise: 8-22 mins $Therapeutic Activity: (P) 8-22 mins                    G Codes:       Pg [phone]    Markeya Mincy 12/29/2017, 1:42 PM

## 2018-05-26 ENCOUNTER — Other Ambulatory Visit: Payer: Self-pay | Admitting: Neurosurgery

## 2018-05-26 DIAGNOSIS — M5416 Radiculopathy, lumbar region: Secondary | ICD-10-CM

## 2018-05-28 ENCOUNTER — Ambulatory Visit
Admission: RE | Admit: 2018-05-28 | Discharge: 2018-05-28 | Disposition: A | Discharge status: Home / Self Care | Payer: BLUE CROSS/BLUE SHIELD | Source: Ambulatory Visit | Attending: Neurosurgery | Admitting: Neurosurgery

## 2018-05-28 DIAGNOSIS — M5416 Radiculopathy, lumbar region: Secondary | ICD-10-CM

## 2018-05-28 MED ORDER — GADOBENATE DIMEGLUMINE 529 MG/ML IV SOLN
20.0000 mL | Freq: Once | INTRAVENOUS | Status: AC | PRN
  Administered 2018-05-28: 20 mL via INTRAVENOUS

## 2018-05-29 ENCOUNTER — Other Ambulatory Visit (HOSPITAL_BASED_OUTPATIENT_CLINIC_OR_DEPARTMENT_OTHER): Payer: Self-pay | Admitting: Neurosurgery

## 2018-05-29 DIAGNOSIS — M5416 Radiculopathy, lumbar region: Secondary | ICD-10-CM

## 2018-09-06 ENCOUNTER — Other Ambulatory Visit: Payer: Self-pay | Admitting: Neurosurgery

## 2018-09-26 NOTE — H&P (Signed)
Patient ID:   [id number] Patient: Dustin Keith  Date of Birth: 11-29-1946 Visit Type: Office Visit   Date: 09/06/2018 10:00 AM Provider: Marchia Meiers. Vertell Limber MD   This 72 year old male presents for back pain.  HISTORY OF PRESENT ILLNESS:  1.  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  We went over the radiographs and MRI and I answered his questions.  He had a pacemaker placed on the 1st of September and is doing well with that.  He then attic prostate coring 2 weeks ago.  Both the patient and I have recommended that he take some time to recover from those procedures before going ahead with this.  The plan will be to perform right sided approach on 10/19/2018.  Risks and benefits were discussed in detail with the patient he was fitted for a large habitus TLSO brace.         Medical/Surgical/Interim History Reviewed, no change.  Last detailed document date:04/21/2015.     PAST MEDICAL HISTORY, SURGICAL HISTORY, FAMILY HISTORY, SOCIAL HISTORY AND REVIEW OF SYSTEMS I have reviewed the patient's past medical, surgical, family and social history as well as the comprehensive review of systems as included on the [facility] history form dated 05/22/2018, which I have signed.  Family History:  Reviewed, no changes.  Last detailed document date:04/21/2015.   Social History: Reviewed, no changes. Last detailed document date: 04/21/2015.    MEDICATIONS: (added, continued or stopped this visit) Started Medication Directions Instruction Stopped   amlodipine 10 mg tablet take 1 tablet by oral route  every day     Depo-Testosterone inject 1 milliliter by intramuscular route  every 2 weeks     Synthroid 100 mcg tablet take 1 tablet by oral route  every day       ALLERGIES: Ingredient  Reaction Medication Name Comment  GABAPENTIN mental issues        PHYSICAL EXAM:   Vitals Date Temp F BP Pulse Ht In Wt Lb BMI BSA Pain Score  09/06/2018  144/74 76 69 256 37.8  4/10      IMPRESSION:   Planned for right L1 to XL IF with lateral plating for adjacent segment degeneration and stenosis.  PLAN:  Risks and benefits were discussed in detail with the patient.  Nurse education was performed.  Patient was fitted for a TLSO brace.  Surgery scheduled for 10/19/2018.   Assessment/Plan   # Detail Type Description   1. 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       2. Assessment Disc displacement, lumbar (M51.26).       3. Assessment Radiculopathy, lumbar region (M54.16).       4. Assessment Low back pain, unspecified back pain laterality, with sciatica presence unspecified (M54.5).                     Provider:  Marchia Meiers. Vertell Limber MD  09/09/2018 05:00 PM Dictation edited by: Marchia Meiers. Vertell Limber    CC Providers: [address] [place],  [place]  [id number]   [facility] [address] [place] [address], [place] [address]-               Electronically signed by Marchia Meiers. Vertell Limber MD on 09/09/2018  05:00 PM

## 2018-10-11 ENCOUNTER — Other Ambulatory Visit (HOSPITAL_COMMUNITY): Payer: BLUE CROSS/BLUE SHIELD

## 2018-10-12 NOTE — Pre-Procedure Instructions (Signed)
Tregan Read  10/12/2018     Your procedure is scheduled on Thursday, October 31.  Report to [facility] Admitting at 5:30 AM               Your surgery or procedure is scheduled for 7:30 AM        Call this number if you have problems the morning of surgery: [phone]  This is the number for the Pre- Surgical Desk.                 For any other questions, please call [phone], Monday - Friday 8 AM - 4 PM.   Remember:  Do not eat or drink after midnight Wednesday, October 30.   Take these medicines the morning of surgery with A SIP OF WATER : amLODipine (NORVASC)  levothyroxine (SYNTHROID, LEVOTHROID)  ezetimibe (ZETIA)  May use Eye Drops if needed.  STOP taking Aspiri , Aspirin Products (Goody Powder, Excedrin Migraine), Ibuprofen (Advil), Naproxen (Aleve), Vitamins and Herbal Products (ie Fish Oil).  Special instructions:  [facility]- Preparing For Surgery  Before surgery, you can play an important role. Because skin is not sterile, your skin needs to be as free of germs as possible. You can reduce the number of germs on your skin by washing with CHG (chlorahexidine gluconate) Soap before surgery.  CHG is an antiseptic cleaner which kills germs and bonds with the skin to continue killing germs even after washing.    Oral Hygiene is also important to reduce your risk of infection.  Remember - BRUSH YOUR TEETH THE MORNING OF SURGERY WITH YOUR REGULAR TOOTHPASTE  Please do not use if you have an allergy to CHG or antibacterial soaps. If your skin becomes reddened/irritated stop using the CHG.  Do not shave (including legs and underarms) for at least 48 hours prior to first CHG shower. It is OK to shave your face.  Please follow these instructions carefully.   1. Shower the NIGHT BEFORE SURGERY and the MORNING OF SURGERY with CHG.   2. If you chose to wash your hair, wash your hair first as usual with your normal shampoo.  3. After you shampoo, wash your face  and private area with the soap you use at home, then  rinse your hair and body thoroughly to remove the shampoo and soap.  4. Use CHG as you would any other liquid soap. You can apply CHG directly to the skin and wash gently with a scrungie or a clean washcloth.   5. Apply the CHG Soap to your body ONLY FROM THE NECK DOWN.  Do not use on open wounds or open sores. Avoid contact with your eyes, ears, mouth and genitals (private parts). Wash Face and genitals (private parts)  with your normal soap.  6. Wash thoroughly, paying special attention to the area where your surgery will be performed.  7. Thoroughly rinse your body with warm water from the neck down.  8. DO NOT shower/wash with your normal soap after using and rinsing off the CHG Soap.  9. Pat yourself dry with a CLEAN TOWEL.  10. Wear CLEAN PAJAMAS to bed the night before surgery, wear comfortable clothes the morning of surgery  11. Place CLEAN SHEETS on your bed the night of your first shower and DO NOT SLEEP WITH PETS.  Day of Surgery: Shower as written above  Do not apply any deodorants/lotions, powders or colognes.  Please wear clean clothes to the hospital/surgery center.  Remember to brush your teeth WITH YOUR REGULAR TOOTHPASTE.             Do not wear jewelry, make-up or nail polish.  Do not shave 48 hours prior to surgery.  Men may shave face and neck.  Do not bring valuables to the hospital.  [facility] is not responsible for any belongings or valuables.  Contacts, dentures or bridgework may not be worn into surgery.  Leave your suitcase in the car.  After surgery it may be brought to your room.  For patients admitted to the hospital, discharge time will be determined by your treatment team.  Please read over the following fact sheets that you were given.

## 2018-10-13 ENCOUNTER — Encounter (HOSPITAL_COMMUNITY): Payer: Self-pay

## 2018-10-13 ENCOUNTER — Other Ambulatory Visit: Payer: Self-pay

## 2018-10-13 ENCOUNTER — Encounter (HOSPITAL_COMMUNITY)
Admission: RE | Admit: 2018-10-13 | Discharge: 2018-10-13 | Disposition: A | Discharge status: Home / Self Care | Payer: BLUE CROSS/BLUE SHIELD | Source: Ambulatory Visit | Attending: Neurosurgery | Admitting: Neurosurgery

## 2018-10-13 DIAGNOSIS — Z01812 Encounter for preprocedural laboratory examination: Secondary | ICD-10-CM | POA: Diagnosis present

## 2018-10-13 DIAGNOSIS — Z7901 Long term (current) use of anticoagulants: Secondary | ICD-10-CM | POA: Insufficient documentation

## 2018-10-13 DIAGNOSIS — Z79890 Hormone replacement therapy (postmenopausal): Secondary | ICD-10-CM | POA: Insufficient documentation

## 2018-10-13 DIAGNOSIS — I129 Hypertensive chronic kidney disease with stage 1 through stage 4 chronic kidney disease, or unspecified chronic kidney disease: Secondary | ICD-10-CM | POA: Diagnosis not present

## 2018-10-13 DIAGNOSIS — N183 Chronic kidney disease, stage 3 (moderate): Secondary | ICD-10-CM | POA: Insufficient documentation

## 2018-10-13 DIAGNOSIS — Z950 Presence of cardiac pacemaker: Secondary | ICD-10-CM | POA: Insufficient documentation

## 2018-10-13 DIAGNOSIS — E039 Hypothyroidism, unspecified: Secondary | ICD-10-CM | POA: Diagnosis not present

## 2018-10-13 DIAGNOSIS — M48061 Spinal stenosis, lumbar region without neurogenic claudication: Secondary | ICD-10-CM | POA: Insufficient documentation

## 2018-10-13 DIAGNOSIS — G4733 Obstructive sleep apnea (adult) (pediatric): Secondary | ICD-10-CM | POA: Insufficient documentation

## 2018-10-13 DIAGNOSIS — Z79899 Other long term (current) drug therapy: Secondary | ICD-10-CM | POA: Diagnosis not present

## 2018-10-13 HISTORY — DX: Other specified heart block: I45.5

## 2018-10-13 HISTORY — DX: Transient cerebral ischemic attack, unspecified: G45.9

## 2018-10-13 HISTORY — DX: Unspecified atrioventricular block: I44.30

## 2018-10-13 HISTORY — DX: Presence of cardiac pacemaker: Z95.0

## 2018-10-13 HISTORY — DX: Cardiac arrhythmia, unspecified: I49.9

## 2018-10-13 LAB — BASIC METABOLIC PANEL
ANION GAP: 7 (ref 5–15)
BUN: 16 mg/dL (ref 8–23)
CO2: 23 mmol/L (ref 22–32)
Calcium: 9 mg/dL (ref 8.9–10.3)
Chloride: 108 mmol/L (ref 98–111)
Creatinine, Ser: 1.39 mg/dL — ABNORMAL HIGH (ref 0.61–1.24)
GFR calc Af Amer: 57 mL/min — ABNORMAL LOW (ref >60)
GFR calc non Af Amer: 49 mL/min — ABNORMAL LOW (ref >60)
GLUCOSE: 102 mg/dL — AB (ref 70–99)
POTASSIUM: 4 mmol/L (ref 3.5–5.1)
Sodium: 138 mmol/L (ref 135–145)

## 2018-10-13 LAB — CBC
HCT: 50.2 % (ref 39.0–52.0)
HEMOGLOBIN: 16.5 g/dL (ref 13.0–17.0)
MCH: 30.9 pg (ref 26.0–34.0)
MCHC: 32.9 g/dL (ref 30.0–36.0)
MCV: 94 fL (ref 80.0–100.0)
Platelets: 213 10*3/uL (ref 150–400)
RBC: 5.34 MIL/uL (ref 4.22–5.81)
RDW: 13.9 % (ref 11.5–15.5)
WBC: 7.7 10*3/uL (ref 4.0–10.5)
nRBC: 0 % (ref 0.0–0.2)

## 2018-10-13 LAB — TYPE AND SCREEN
ABO/RH(D): O POS
Antibody Screen: NEGATIVE

## 2018-10-13 LAB — SURGICAL PCR SCREEN
MRSA, PCR: NEGATIVE
STAPHYLOCOCCUS AUREUS: NEGATIVE

## 2018-10-13 NOTE — Pre-Procedure Instructions (Signed)
Edel Rivero  10/13/2018     Your procedure is scheduled on Thursday, October 31.  Report to [facility] Admitting at 5:30 AM               Your surgery or procedure is scheduled for 7:30 AM        Call this number if you have problems the morning of surgery: [phone]  This is the number for the Pre- Surgical Desk.                 For any other questions, please call [phone], Monday - Friday 8 AM - 4 PM.   Remember:  Do not eat or drink after midnight Wednesday, October 30.   Take these medicines the morning of surgery with A SIP OF WATER :  levothyroxine (SYNTHROID, LEVOTHROID)    May use Eye Drops if needed.  STOP taking Aspirin , Aspirin Products (Goody Powder, Excedrin Migraine), Ibuprofen (Advil), Naproxen (Aleve), Vitamins and Herbal Products (ie Fish Oil).  Special instructions:  [facility]- Preparing For Surgery  Before surgery, you can play an important role. Because skin is not sterile, your skin needs to be as free of germs as possible. You can reduce the number of germs on your skin by washing with CHG (chlorahexidine gluconate) Soap before surgery.  CHG is an antiseptic cleaner which kills germs and bonds with the skin to continue killing germs even after washing.    Oral Hygiene is also important to reduce your risk of infection.  Remember - BRUSH YOUR TEETH THE MORNING OF SURGERY WITH YOUR REGULAR TOOTHPASTE  Please do not use if you have an allergy to CHG or antibacterial soaps. If your skin becomes reddened/irritated stop using the CHG.  Do not shave (including legs and underarms) for at least 48 hours prior to first CHG shower. It is OK to shave your face.  Please follow these instructions carefully.   1. Shower the NIGHT BEFORE SURGERY and the MORNING OF SURGERY with CHG.   2. If you chose to wash your hair, wash your hair first as usual with your normal shampoo.  3. After you shampoo, wash your face and private area with the soap you  use at home, then  rinse your hair and body thoroughly to remove the shampoo and soap.  4. Use CHG as you would any other liquid soap. You can apply CHG directly to the skin and wash gently with a scrungie or a clean washcloth.   5. Apply the CHG Soap to your body ONLY FROM THE NECK DOWN.  Do not use on open wounds or open sores. Avoid contact with your eyes, ears, mouth and genitals (private parts). Wash Face and genitals (private parts)  with your normal soap.  6. Wash thoroughly, paying special attention to the area where your surgery will be performed.  7. Thoroughly rinse your body with warm water from the neck down.  8. DO NOT shower/wash with your normal soap after using and rinsing off the CHG Soap.  9. Pat yourself dry with a CLEAN TOWEL.  10. Wear CLEAN PAJAMAS to bed the night before surgery, wear comfortable clothes the morning of surgery  11. Place CLEAN SHEETS on your bed the night of your first shower and DO NOT SLEEP WITH PETS.  Day of Surgery: Shower as written above  Do not apply any deodorants/lotions, powders or colognes.  Please wear clean clothes to the hospital/surgery center.   Remember to  brush your teeth WITH YOUR REGULAR TOOTHPASTE.             Do not wear jewelry.  Do not shave 48 hours prior to surgery.  Men may shave face and neck.  Do not bring valuables to the hospital.  [facility] is not responsible for any belongings or valuables.  Contacts, dentures or bridgework may not be worn into surgery.  Leave your suitcase in the car.  After surgery it may be brought to your room.  For patients admitted to the hospital, discharge time will be determined by your treatment team.  Please read over the following fact sheets that you were given.

## 2018-10-13 NOTE — Progress Notes (Signed)
PCP: Dr. Nicoletta Dress in [facility] Cardiologist: Dr. Ola Spurr  EKG: 09-13-2018--tracing requested, placed in chart CXR: denies ECHO: 07-2018, C.E. Stress Test: 03-2016, C.E. Cardiac Cath: 2017, C.E. Sleep Study, 7- 2016, C.E.---wears Cpap nightly, requested to bring CPAP mask   Pt has been off Xarelto and Aspirin for 2 months (was off prior to pacemaker insertion)  Device orders faxed--awating orders, email to [employer] sent.  Pt reports having CP in September 2019 following pacemaker insertion.  Went to ED, reports "everything was fine, possibly muscle related due to moving." Encouraged to report any CP to cardiologist or go to ED.  Pt has not experienced any CP since then.   Patient denies shortness of breath, fever, cough, and chest pain at PAT appointment.  Patient verbalized understanding of instructions provided today at the PAT appointment.  Patient asked to review instructions at home and day of surgery.   Chart forwarded to anesthesia.

## 2018-10-16 NOTE — Anesthesia Preprocedure Evaluation (Addendum)
Anesthesia Evaluation  Patient identified by MRN, date of birth, ID band Patient awake    Reviewed: Allergy & Precautions, NPO status , Patient's Chart, lab work & pertinent test results  History of Anesthesia Complications Negative for: history of anesthetic complications  Airway Mallampati: II  TM Distance: >3 FB Neck ROM: Full    Dental  (+) Teeth Intact, Dental Advisory Given   Pulmonary sleep apnea and Continuous Positive Airway Pressure Ventilation ,    Pulmonary exam normal        Cardiovascular hypertension, Pt. on medications Normal cardiovascular exam+ dysrhythmias + pacemaker    Myocardial perfusion stress 05/02/2018 (care everywhere): Conclusions Summary Fixed inferior defect with minimal wall motion abnormality consistent with diaphragmatic attenuation artifact vs. old MI. There is no evidence of induced ischemia. Gated imaging shows mild hypokinesis of the inferior wall with EF 56%.     Neuro/Psych TIAnegative psych ROS   GI/Hepatic negative GI ROS, Neg liver ROS,   Endo/Other  Hypothyroidism Morbid obesity  Renal/GU negative Renal ROS     Musculoskeletal negative musculoskeletal ROS (+)   Abdominal   Peds  Hematology negative hematology ROS (+)   Anesthesia Other Findings Day of surgery medications reviewed with the patient.  Reproductive/Obstetrics                                                           Anesthesia Evaluation  Patient identified by MRN, date of birth, ID band Patient awake    Reviewed: Allergy & Precautions, NPO status , Patient's Chart, lab work & pertinent test results  Airway Mallampati: II  TM Distance: >3 FB Neck ROM: Full    Dental no notable dental hx.    Pulmonary neg pulmonary ROS, sleep apnea and Continuous Positive Airway Pressure Ventilation ,    Pulmonary exam normal breath sounds clear to auscultation        Cardiovascular hypertension, negative cardio ROS Normal cardiovascular exam Rhythm:Regular Rate:Normal     Neuro/Psych negative neurological ROS  negative psych ROS   GI/Hepatic negative GI ROS, Neg liver ROS,   Endo/Other  negative endocrine ROS  Renal/GU negative Renal ROS  negative genitourinary   Musculoskeletal negative musculoskeletal ROS (+) Spinal Fusion L3-S1   Abdominal   Peds negative pediatric ROS (+)  Hematology negative hematology ROS (+) Prothrombin Time 11.4 - 15.2 seconds 13.2  INR  1.01      Anesthesia Other Findings WBC 4.0 - 10.5 K/uL 7.2  RBC 4.22 - 5.81 MIL/uL 4.93  Hemoglobin 13.0 - 17.0 g/dL 15.7  Sodium 135 - 145 mmol/L 136  Potassium 3.5 - 5.1 mmol/L 4.1  Chloride 101 - 111 mmol/L 105  CO2 22 - 32 mmol/L 24  Glucose, Bld 65 - 99 mg/dL 97  BUN 6 - 20 mg/dL 20     Reproductive/Obstetrics negative OB ROS                            Anesthesia Physical Anesthesia Plan  ASA: III  Anesthesia Plan: General   Post-op Pain Management:    Induction: Intravenous  PONV Risk Score and Plan: 1  Airway Management Planned: Oral ETT  Additional Equipment:   Intra-op Plan:   Post-operative Plan: Extubation in OR  Informed Consent: I have reviewed  the patients History and Physical, chart, labs and discussed the procedure including the risks, benefits and alternatives for the proposed anesthesia with the patient or authorized representative who has indicated his/her understanding and acceptance.     Plan Discussed with: CRNA and Anesthesiologist  Anesthesia Plan Comments:       Anesthesia Quick Evaluation  Anesthesia Physical Anesthesia Plan  ASA: III  Anesthesia Plan: General   Post-op Pain Management:    Induction: Intravenous  PONV Risk Score and Plan: 3 and Ondansetron, Dexamethasone and Scopolamine patch - Pre-op  Airway Management Planned: Oral ETT  Additional Equipment: None  Intra-op  Plan:   Post-operative Plan: Extubation in OR  Informed Consent: I have reviewed the patients History and Physical, chart, labs and discussed the procedure including the risks, benefits and alternatives for the proposed anesthesia with the patient or authorized representative who has indicated his/her understanding and acceptance.   Dental advisory given  Plan Discussed with: CRNA, Anesthesiologist and Surgeon  Anesthesia Plan Comments: (See PAT note written 10/16/2018 by Karoline Caldwell, PA-C )     Anesthesia Quick Evaluation

## 2018-10-16 NOTE — Progress Notes (Signed)
Anesthesia Chart Review:  Case:  [id number] Date/Time:  10/19/18 0715   Procedure:  Lumbar 1-2 Anterolateral lumbar interbody fusion with lateral plate (N/A ) - Lumbar 1-2 Anterolateral lumbar interbody fusion with lateral plate   Anesthesia type:  General   Pre-op diagnosis:  Spinal stenosis, Lumbar region with out neurogenic claudication   Location:  [facility] OR ROOM 21 / [place] [facility]   Surgeon:  Erline Levine, MD      DISCUSSION: 72 yo male never smoker. 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  Cath 2017 showed nonobstructive disease of the LAD with normal FFR and normal LV function. Myocardial perfusion scan 04/2018 showed no evidence of ischemia, mild hypokinesis of the inferior wall with EF 56%. Cardiac event monitor 05/2018 showed multiple pauses ranging from 3 to 8 seconds (per notes more frequent at night and possible due to CPAP noncompliance, though he has had some daytime syncope). Echo 07/2018 with nl wall motion, nl valves, EF 65-70%. [employer] pacemaker implanted 07/27/2018. Notes in care everywhere indicate pt has had several recent ED visits for fatigue/CP/anxiety. Seen by PCP 09/15/2018, per notes cardiac etiology unlikely, referred to endocrinology for management of hypothyroid.   Per PAT nurse note pt currently off xarelto and ASA.  Anticipate he can proceed as planned barring acute status change. Chart left for nurse followup on PPM device form.  VS: BP 139/72   Pulse 90   Temp 36.8 C (Oral)   Ht 5\' 10"  (1.778 m)   Wt 119.3 kg   SpO2 97%   BMI 37.72 kg/m   PROVIDERS: Kristopher Glee., MD is PCP  Adrian Prows, MD is Cardiologist  LABS: Labs reviewed: Acceptable for surgery. C/w pt's CKD II-III (all labs ordered are listed, but only abnormal results are displayed)  Labs Reviewed  BASIC METABOLIC PANEL - Abnormal; Notable for the following components:      Result Value   Glucose, Bld 102 (*)    Creatinine, Ser 1.39 (*)    GFR calc non Af Amer 49 (*)    GFR calc Af Amer 57 (*)    All other components within normal limits  SURGICAL PCR SCREEN  CBC  TYPE AND SCREEN     IMAGES: CHEST - 2 VIEW 07/28/2018 (care everywhere):  COMPARISON: 07/27/2018  FINDINGS: Cardiac shadow is stable. Pacing device is again seen and stable. No pneumothorax is noted. Minimal platelike atelectasis is noted in the right mid lung. No other focal abnormality is seen.  IMPRESSION: Minimal right midlung atelectasis. No pneumothorax is noted.  EKG: 09/13/2018 (tracing on chart): Sinus rhythm First degree A-V block. Left axis deviation. Possible LVH. When compared with ECG of 28-Jul-2018 No significant change was found.  CV: Carotid doppler 07/26/2018 (care everywhere): Conclusions  Summary  1-39% stenosis bilaterally in the internal carotid arteries. No significant  plaque noted.  TTE 07/26/2018 (care everywhere): Normal Echocardiogram Ejection fraction is visually estimated at 65-70%  TTE 06/13/2018 (care everywhere): Summary Normal left ventricular size and systolic function with no appreciable segmental abnormality. Ejection fraction is visually estimated at 55-60%. Normal right ventricular function by tricuspid annular excursion. The aortic valve leaflets were not well visualized. No stenosis Trace tricuspid regurgitation. No evidence of pericardial effusion. Signature  Cardiac event monitor 06/07/2018 (care everywhere): CONCLUSIONS: 1. Abnormal event monitor. Multiple pauses were captured during the monitoring. Ranging from 3 to 8 seconds. These were apparently asymptomatic. 2. Arrhythmias as described 3. Symptoms were not reported  Myocardial perfusion stress 05/02/2018 (care everywhere): Conclusions  Summary  Fixed inferior defect with minimal wall motion abnormality consistent with  diaphragmatic attenuation artifact vs. old MI. There is no evidence of  induced ischemia.  Gated imaging shows mild  hypokinesis of the inferior wall with EF 56%.    Cath 05/10/2016 (care everywhere):  Diagnostic procedure: Selective Coronaries, Left Ventricular Injection  Miscellaneous: Complications: No Complications. Conclusions Diagnostic Procedure Summary: non-obstructive coronary artery disease LAD with normal FFR Normal LV function  Past Medical History:  Diagnosis Date  . Arthritis   . AV heart block   . Cancer (HCC)    DUODENAL   . Dysrhythmia   . Hypertension   . Hypothyroidism   . Presence of permanent cardiac pacemaker   . Sinus pause   . Sleep apnea    CPAP   . TIA (transient ischemic attack)     Past Surgical History:  Procedure Laterality Date  . BACK SURGERY     Lumbar x5  . DUODENAL DIVERTICULECTOMY     02/2015   [facility]  DR EVANS  . HERNIA REPAIR    . INSERT / REPLACE / REMOVE PACEMAKER    . KNEE ARTHROSCOPY     RIGHT  . SHOULDER SURGERY     RIGHT  . TOTAL HIP ARTHROPLASTY Left 12/28/2017   Procedure: LEFT TOTAL HIP ARTHROPLASTY ANTERIOR APPROACH;  Surgeon: Gaynelle Arabian, MD;  Location: [facility] ORS;  Service: Orthopedics;  Laterality: Left;    MEDICATIONS: . amLODipine (NORVASC) 5 MG tablet  . Ascorbic Acid (VITAMIN C PO)  . ezetimibe (ZETIA) 10 MG tablet  . levothyroxine (SYNTHROID, LEVOTHROID) 100 MCG tablet  . losartan (COZAAR) 100 MG tablet  . methocarbamol (ROBAXIN) 500 MG tablet  . Multiple Vitamins-Minerals (MULTIVITAMIN PO)  . niacin 100 MG tablet  . oxyCODONE (OXY IR/ROXICODONE) 5 MG immediate release tablet  . Polyethyl Glycol-Propyl Glycol (SYSTANE) 0.4-0.3 % SOLN  . rivaroxaban (XARELTO) 10 MG TABS tablet  . testosterone cypionate (DEPOTESTOSTERONE CYPIONATE) 200 MG/ML injection  . traMADol (ULTRAM) 50 MG tablet   No current facility-administered medications for this encounter.      Wynonia Musty [facility] Short Stay Center/Anesthesiology Phone [phone] 10/16/2018 11:28 AM

## 2018-10-18 MED ORDER — DEXTROSE 5 % IV SOLN
3.0000 g | INTRAVENOUS | Status: AC
  Administered 2018-10-19: 3 g via INTRAVENOUS
  Filled 2018-10-18: qty 3

## 2018-10-19 ENCOUNTER — Inpatient Hospital Stay (HOSPITAL_COMMUNITY)
Admission: RE | Disposition: A | Discharge status: Home / Self Care | Payer: Self-pay | Source: Home / Self Care | Attending: Neurosurgery

## 2018-10-19 ENCOUNTER — Other Ambulatory Visit: Payer: Self-pay

## 2018-10-19 ENCOUNTER — Inpatient Hospital Stay (HOSPITAL_COMMUNITY): Payer: BLUE CROSS/BLUE SHIELD | Admitting: Physician Assistant

## 2018-10-19 ENCOUNTER — Inpatient Hospital Stay (HOSPITAL_COMMUNITY)
Admission: RE | Admit: 2018-10-19 | Discharge: 2018-10-20 | DRG: 460 | Disposition: A | Discharge status: Home / Self Care | Payer: BLUE CROSS/BLUE SHIELD | Attending: Neurosurgery | Admitting: Neurosurgery

## 2018-10-19 ENCOUNTER — Inpatient Hospital Stay (HOSPITAL_COMMUNITY): Payer: BLUE CROSS/BLUE SHIELD | Admitting: Anesthesiology

## 2018-10-19 ENCOUNTER — Inpatient Hospital Stay (HOSPITAL_COMMUNITY): Payer: BLUE CROSS/BLUE SHIELD

## 2018-10-19 ENCOUNTER — Encounter (HOSPITAL_COMMUNITY): Payer: Self-pay | Admitting: Urology

## 2018-10-19 DIAGNOSIS — Z419 Encounter for procedure for purposes other than remedying health state, unspecified: Secondary | ICD-10-CM

## 2018-10-19 DIAGNOSIS — Z888 Allergy status to other drugs, medicaments and biological substances status: Secondary | ICD-10-CM | POA: Diagnosis not present

## 2018-10-19 DIAGNOSIS — E039 Hypothyroidism, unspecified: Secondary | ICD-10-CM | POA: Diagnosis present

## 2018-10-19 DIAGNOSIS — Z79890 Hormone replacement therapy (postmenopausal): Secondary | ICD-10-CM | POA: Diagnosis not present

## 2018-10-19 DIAGNOSIS — M5116 Intervertebral disc disorders with radiculopathy, lumbar region: Secondary | ICD-10-CM | POA: Diagnosis present

## 2018-10-19 DIAGNOSIS — M40209 Unspecified kyphosis, site unspecified: Secondary | ICD-10-CM | POA: Diagnosis present

## 2018-10-19 DIAGNOSIS — M549 Dorsalgia, unspecified: Secondary | ICD-10-CM | POA: Diagnosis present

## 2018-10-19 DIAGNOSIS — Z79891 Long term (current) use of opiate analgesic: Secondary | ICD-10-CM | POA: Diagnosis not present

## 2018-10-19 DIAGNOSIS — I10 Essential (primary) hypertension: Secondary | ICD-10-CM | POA: Diagnosis present

## 2018-10-19 DIAGNOSIS — Z9989 Dependence on other enabling machines and devices: Secondary | ICD-10-CM | POA: Diagnosis not present

## 2018-10-19 DIAGNOSIS — Z6837 Body mass index (BMI) 37.0-37.9, adult: Secondary | ICD-10-CM | POA: Diagnosis not present

## 2018-10-19 DIAGNOSIS — G4730 Sleep apnea, unspecified: Secondary | ICD-10-CM | POA: Diagnosis present

## 2018-10-19 DIAGNOSIS — M48061 Spinal stenosis, lumbar region without neurogenic claudication: Principal | ICD-10-CM | POA: Diagnosis present

## 2018-10-19 HISTORY — PX: ANTERIOR LAT LUMBAR FUSION: SHX1168

## 2018-10-19 SURGERY — ANTERIOR LATERAL LUMBAR FUSION 1 LEVEL
Anesthesia: General | Site: Flank | Laterality: Right

## 2018-10-19 MED ORDER — OXYCODONE HCL 5 MG PO TABS
5.0000 mg | ORAL_TABLET | ORAL | Status: DC | PRN
  Administered 2018-10-19: 5 mg via ORAL

## 2018-10-19 MED ORDER — PROPOFOL 10 MG/ML IV BOLUS
INTRAVENOUS | Status: AC
  Filled 2018-10-19: qty 20

## 2018-10-19 MED ORDER — SUCCINYLCHOLINE CHLORIDE 200 MG/10ML IV SOSY
PREFILLED_SYRINGE | INTRAVENOUS | Status: DC | PRN
  Administered 2018-10-19: 120 mg via INTRAVENOUS

## 2018-10-19 MED ORDER — VITAMIN C 500 MG PO TABS
500.0000 mg | ORAL_TABLET | Freq: Two times a day (BID) | ORAL | Status: DC
  Administered 2018-10-19 – 2018-10-20 (×2): 500 mg via ORAL
  Filled 2018-10-19 (×2): qty 1

## 2018-10-19 MED ORDER — SCOPOLAMINE 1 MG/3DAYS TD PT72
1.0000 | MEDICATED_PATCH | TRANSDERMAL | Status: DC
  Administered 2018-10-19: 1.5 mg via TRANSDERMAL
  Filled 2018-10-19: qty 1

## 2018-10-19 MED ORDER — HYDROMORPHONE HCL 1 MG/ML IJ SOLN
INTRAMUSCULAR | Status: AC
  Filled 2018-10-19: qty 1

## 2018-10-19 MED ORDER — THROMBIN 5000 UNITS EX SOLR
CUTANEOUS | Status: AC
  Filled 2018-10-19: qty 5000

## 2018-10-19 MED ORDER — MIDAZOLAM HCL 5 MG/5ML IJ SOLN
INTRAMUSCULAR | Status: DC | PRN
  Administered 2018-10-19: 2 mg via INTRAVENOUS

## 2018-10-19 MED ORDER — SODIUM CHLORIDE 0.9% FLUSH: 3.0000 mL | Freq: Two times a day (BID) | INTRAVENOUS | Status: DC

## 2018-10-19 MED ORDER — KCL IN DEXTROSE-NACL 20-5-0.45 MEQ/L-%-% IV SOLN: INTRAVENOUS | Status: DC

## 2018-10-19 MED ORDER — LEVOTHYROXINE SODIUM 100 MCG PO TABS
100.0000 ug | ORAL_TABLET | Freq: Every day | ORAL | Status: DC
  Administered 2018-10-20: 100 ug via ORAL
  Filled 2018-10-19: qty 1

## 2018-10-19 MED ORDER — TESTOSTERONE CYPIONATE 200 MG/ML IM SOLN: 200.0000 mg | INTRAMUSCULAR | Status: DC

## 2018-10-19 MED ORDER — EZETIMIBE 10 MG PO TABS
10.0000 mg | ORAL_TABLET | Freq: Every day | ORAL | Status: DC
  Administered 2018-10-19 – 2018-10-20 (×2): 10 mg via ORAL
  Filled 2018-10-19 (×2): qty 1

## 2018-10-19 MED ORDER — LIDOCAINE-EPINEPHRINE 1 %-1:100000 IJ SOLN
INTRAMUSCULAR | Status: AC
  Filled 2018-10-19: qty 1

## 2018-10-19 MED ORDER — CHLORHEXIDINE GLUCONATE CLOTH 2 % EX PADS: 6.0000 | MEDICATED_PAD | Freq: Once | CUTANEOUS | Status: DC

## 2018-10-19 MED ORDER — HEMOSTATIC AGENTS (NO CHARGE) OPTIME
TOPICAL | Status: DC | PRN
  Administered 2018-10-19: 1 via TOPICAL

## 2018-10-19 MED ORDER — ZOLPIDEM TARTRATE 5 MG PO TABS: 5.0000 mg | ORAL_TABLET | Freq: Every evening | ORAL | Status: DC | PRN

## 2018-10-19 MED ORDER — PHENYLEPHRINE 40 MCG/ML (10ML) SYRINGE FOR IV PUSH (FOR BLOOD PRESSURE SUPPORT)
PREFILLED_SYRINGE | INTRAVENOUS | Status: DC | PRN
  Administered 2018-10-19 (×3): 80 ug via INTRAVENOUS

## 2018-10-19 MED ORDER — ONDANSETRON HCL 4 MG/2ML IJ SOLN
INTRAMUSCULAR | Status: AC
  Filled 2018-10-19: qty 2

## 2018-10-19 MED ORDER — METHOCARBAMOL 500 MG PO TABS
500.0000 mg | ORAL_TABLET | Freq: Four times a day (QID) | ORAL | Status: DC | PRN
  Administered 2018-10-19 – 2018-10-20 (×3): 500 mg via ORAL
  Filled 2018-10-19 (×3): qty 1

## 2018-10-19 MED ORDER — LIDOCAINE-EPINEPHRINE 1 %-1:100000 IJ SOLN
INTRAMUSCULAR | Status: DC | PRN
  Administered 2018-10-19: 5 mL

## 2018-10-19 MED ORDER — SUFENTANIL CITRATE 50 MCG/ML IV SOLN
INTRAVENOUS | Status: AC
  Filled 2018-10-19: qty 1

## 2018-10-19 MED ORDER — PANTOPRAZOLE SODIUM 40 MG IV SOLR
40.0000 mg | Freq: Every day | INTRAVENOUS | Status: DC
  Administered 2018-10-19: 40 mg via INTRAVENOUS

## 2018-10-19 MED ORDER — PROMETHAZINE HCL 25 MG/ML IJ SOLN: 6.2500 mg | INTRAMUSCULAR | Status: DC | PRN

## 2018-10-19 MED ORDER — OXYCODONE HCL 5 MG PO TABS
ORAL_TABLET | ORAL | Status: AC
  Filled 2018-10-19: qty 1

## 2018-10-19 MED ORDER — ADULT MULTIVITAMIN W/MINERALS CH
ORAL_TABLET | Freq: Every day | ORAL | Status: DC
  Administered 2018-10-20: 1 via ORAL
  Filled 2018-10-19: qty 1

## 2018-10-19 MED ORDER — HYDROMORPHONE HCL 1 MG/ML IJ SOLN
0.2500 mg | INTRAMUSCULAR | Status: DC | PRN
  Administered 2018-10-19 (×3): 0.5 mg via INTRAVENOUS

## 2018-10-19 MED ORDER — OXYCODONE HCL 5 MG PO TABS
10.0000 mg | ORAL_TABLET | ORAL | Status: DC | PRN
  Administered 2018-10-19 – 2018-10-20 (×3): 10 mg via ORAL
  Filled 2018-10-19 (×2): qty 2

## 2018-10-19 MED ORDER — SODIUM CHLORIDE 0.9 % IV SOLN
INTRAVENOUS | Status: DC | PRN
  Administered 2018-10-19: 100 ug/min via INTRAVENOUS

## 2018-10-19 MED ORDER — BUPIVACAINE HCL (PF) 0.5 % IJ SOLN
INTRAMUSCULAR | Status: AC
  Filled 2018-10-19: qty 30

## 2018-10-19 MED ORDER — ONDANSETRON HCL 4 MG PO TABS: 4.0000 mg | ORAL_TABLET | Freq: Four times a day (QID) | ORAL | Status: DC | PRN

## 2018-10-19 MED ORDER — MENTHOL 3 MG MT LOZG: 1.0000 | LOZENGE | OROMUCOSAL | Status: DC | PRN

## 2018-10-19 MED ORDER — SODIUM CHLORIDE 0.9 % IV SOLN: 250.0000 mL | INTRAVENOUS | Status: DC

## 2018-10-19 MED ORDER — LIDOCAINE 2% (20 MG/ML) 5 ML SYRINGE
INTRAMUSCULAR | Status: DC | PRN
  Administered 2018-10-19: 100 mg via INTRAVENOUS

## 2018-10-19 MED ORDER — ONDANSETRON HCL 4 MG/2ML IJ SOLN
INTRAMUSCULAR | Status: DC | PRN
  Administered 2018-10-19: 4 mg via INTRAVENOUS

## 2018-10-19 MED ORDER — PROPOFOL 10 MG/ML IV BOLUS
INTRAVENOUS | Status: DC | PRN
  Administered 2018-10-19: 200 mg via INTRAVENOUS

## 2018-10-19 MED ORDER — ACETAMINOPHEN 650 MG RE SUPP: 650.0000 mg | RECTAL | Status: DC | PRN

## 2018-10-19 MED ORDER — NIACIN 100 MG PO TABS
100.0000 mg | ORAL_TABLET | Freq: Every day | ORAL | Status: DC
  Administered 2018-10-19: 100 mg via ORAL
  Filled 2018-10-19 (×2): qty 1

## 2018-10-19 MED ORDER — SUFENTANIL CITRATE 50 MCG/ML IV SOLN
INTRAVENOUS | Status: DC | PRN
  Administered 2018-10-19: 20 ug via INTRAVENOUS
  Administered 2018-10-19 (×2): 10 ug via INTRAVENOUS

## 2018-10-19 MED ORDER — CEFAZOLIN SODIUM-DEXTROSE 2-4 GM/100ML-% IV SOLN
2.0000 g | Freq: Three times a day (TID) | INTRAVENOUS | Status: AC
  Administered 2018-10-19 (×2): 2 g via INTRAVENOUS
  Filled 2018-10-19 (×2): qty 100

## 2018-10-19 MED ORDER — SODIUM CHLORIDE 0.9 % IJ SOLN
INTRAMUSCULAR | Status: AC
  Filled 2018-10-19: qty 10

## 2018-10-19 MED ORDER — AMLODIPINE BESYLATE 5 MG PO TABS: 5.0000 mg | ORAL_TABLET | Freq: Every day | ORAL | Status: DC

## 2018-10-19 MED ORDER — POLYVINYL ALCOHOL 1.4 % OP SOLN
1.0000 [drp] | Freq: Every day | OPHTHALMIC | Status: DC | PRN
  Filled 2018-10-19: qty 15

## 2018-10-19 MED ORDER — PHENYLEPHRINE HCL 10 MG/ML IJ SOLN
INTRAMUSCULAR | Status: AC
  Filled 2018-10-19: qty 1

## 2018-10-19 MED ORDER — EPHEDRINE SULFATE-NACL 50-0.9 MG/10ML-% IV SOSY
PREFILLED_SYRINGE | INTRAVENOUS | Status: DC | PRN
  Administered 2018-10-19 (×2): 10 mg via INTRAVENOUS

## 2018-10-19 MED ORDER — PHENOL 1.4 % MT LIQD: 1.0000 | OROMUCOSAL | Status: DC | PRN

## 2018-10-19 MED ORDER — SODIUM CHLORIDE 0.9% FLUSH: 3.0000 mL | INTRAVENOUS | Status: DC | PRN

## 2018-10-19 MED ORDER — MIDAZOLAM HCL 2 MG/2ML IJ SOLN
INTRAMUSCULAR | Status: AC
  Filled 2018-10-19: qty 2

## 2018-10-19 MED ORDER — KETAMINE HCL 10 MG/ML IJ SOLN
INTRAMUSCULAR | Status: DC | PRN
  Administered 2018-10-19: 50 mg via INTRAVENOUS

## 2018-10-19 MED ORDER — METHOCARBAMOL 1000 MG/10ML IJ SOLN
500.0000 mg | Freq: Four times a day (QID) | INTRAVENOUS | Status: DC | PRN
  Filled 2018-10-19: qty 5

## 2018-10-19 MED ORDER — BISACODYL 10 MG RE SUPP: 10.0000 mg | Freq: Every day | RECTAL | Status: DC | PRN

## 2018-10-19 MED ORDER — LACTATED RINGERS IV SOLN
INTRAVENOUS | Status: DC | PRN
  Administered 2018-10-19 (×2): via INTRAVENOUS

## 2018-10-19 MED ORDER — LIDOCAINE 2% (20 MG/ML) 5 ML SYRINGE
INTRAMUSCULAR | Status: AC
  Filled 2018-10-19: qty 5

## 2018-10-19 MED ORDER — FLEET ENEMA 7-19 GM/118ML RE ENEM: 1.0000 | ENEMA | Freq: Once | RECTAL | Status: DC | PRN

## 2018-10-19 MED ORDER — THROMBIN (RECOMBINANT) 20000 UNITS EX SOLR
CUTANEOUS | Status: AC
  Filled 2018-10-19: qty 20000

## 2018-10-19 MED ORDER — OXYCODONE HCL 5 MG PO TABS
5.0000 mg | ORAL_TABLET | ORAL | Status: DC | PRN
  Administered 2018-10-19: 5 mg via ORAL
  Filled 2018-10-19 (×4): qty 1

## 2018-10-19 MED ORDER — DEXAMETHASONE SODIUM PHOSPHATE 10 MG/ML IJ SOLN
INTRAMUSCULAR | Status: DC | PRN
  Administered 2018-10-19: 10 mg via INTRAVENOUS

## 2018-10-19 MED ORDER — HYDROCODONE-ACETAMINOPHEN 5-325 MG PO TABS: 2.0000 | ORAL_TABLET | ORAL | Status: DC | PRN

## 2018-10-19 MED ORDER — KETAMINE HCL 50 MG/5ML IJ SOSY
PREFILLED_SYRINGE | INTRAMUSCULAR | Status: AC
  Filled 2018-10-19: qty 5

## 2018-10-19 MED ORDER — ACETAMINOPHEN 325 MG PO TABS
650.0000 mg | ORAL_TABLET | ORAL | Status: DC | PRN
  Administered 2018-10-19: 650 mg via ORAL
  Filled 2018-10-19: qty 2

## 2018-10-19 MED ORDER — THROMBIN 5000 UNITS EX SOLR
CUTANEOUS | Status: DC | PRN
  Administered 2018-10-19 (×2): 5000 [IU] via TOPICAL

## 2018-10-19 MED ORDER — LOSARTAN POTASSIUM 50 MG PO TABS
100.0000 mg | ORAL_TABLET | Freq: Every day | ORAL | Status: DC
  Administered 2018-10-19 – 2018-10-20 (×2): 100 mg via ORAL
  Filled 2018-10-19 (×3): qty 2

## 2018-10-19 MED ORDER — POLYETHYLENE GLYCOL 3350 17 G PO PACK: 17.0000 g | PACK | Freq: Every day | ORAL | Status: DC | PRN

## 2018-10-19 MED ORDER — BUPIVACAINE HCL (PF) 0.5 % IJ SOLN
INTRAMUSCULAR | Status: DC | PRN
  Administered 2018-10-19: 5 mL

## 2018-10-19 MED ORDER — 0.9 % SODIUM CHLORIDE (POUR BTL) OPTIME
TOPICAL | Status: DC | PRN
  Administered 2018-10-19: 1000 mL

## 2018-10-19 MED ORDER — ONDANSETRON HCL 4 MG/2ML IJ SOLN: 4.0000 mg | Freq: Four times a day (QID) | INTRAMUSCULAR | Status: DC | PRN

## 2018-10-19 MED ORDER — MORPHINE SULFATE (PF) 2 MG/ML IV SOLN: 2.0000 mg | INTRAVENOUS | Status: DC | PRN

## 2018-10-19 MED ORDER — SUCCINYLCHOLINE CHLORIDE 200 MG/10ML IV SOSY
PREFILLED_SYRINGE | INTRAVENOUS | Status: AC
  Filled 2018-10-19: qty 10

## 2018-10-19 MED ORDER — TRAMADOL HCL 50 MG PO TABS: 50.0000 mg | ORAL_TABLET | Freq: Four times a day (QID) | ORAL | Status: DC | PRN

## 2018-10-19 MED ORDER — DEXAMETHASONE SODIUM PHOSPHATE 10 MG/ML IJ SOLN
INTRAMUSCULAR | Status: AC
  Filled 2018-10-19: qty 1

## 2018-10-19 MED ORDER — DOCUSATE SODIUM 100 MG PO CAPS
100.0000 mg | ORAL_CAPSULE | Freq: Two times a day (BID) | ORAL | Status: DC
  Administered 2018-10-19 – 2018-10-20 (×3): 100 mg via ORAL
  Filled 2018-10-19 (×3): qty 1

## 2018-10-19 MED ORDER — METHOCARBAMOL 500 MG PO TABS: 500.0000 mg | ORAL_TABLET | Freq: Four times a day (QID) | ORAL | Status: DC | PRN

## 2018-10-19 MED ORDER — ALUM & MAG HYDROXIDE-SIMETH 200-200-20 MG/5ML PO SUSP: 30.0000 mL | Freq: Four times a day (QID) | ORAL | Status: DC | PRN

## 2018-10-19 SURGICAL SUPPLY — 57 items
BLADE CLIPPER SURG (BLADE) ×2 IMPLANT
CARTRIDGE OIL MAESTRO DRILL (MISCELLANEOUS) IMPLANT
COVER WAND RF STERILE (DRAPES) ×2 IMPLANT
DECANTER SPIKE VIAL GLASS SM (MISCELLANEOUS) ×2 IMPLANT
DERMABOND ADVANCED (GAUZE/BANDAGES/DRESSINGS) ×2
DERMABOND ADVANCED .7 DNX12 (GAUZE/BANDAGES/DRESSINGS) ×2 IMPLANT
DIFFUSER DRILL AIR PNEUMATIC (MISCELLANEOUS) IMPLANT
DRAPE C-ARM 42X72 X-RAY (DRAPES) ×2 IMPLANT
DRAPE C-ARMOR (DRAPES) ×2 IMPLANT
DRAPE LAPAROTOMY 100X72X124 (DRAPES) ×2 IMPLANT
DRSG OPSITE POSTOP 3X4 (GAUZE/BANDAGES/DRESSINGS) ×4 IMPLANT
DRSG OPSITE POSTOP 4X6 (GAUZE/BANDAGES/DRESSINGS) ×2 IMPLANT
DURAPREP 26ML APPLICATOR (WOUND CARE) ×2 IMPLANT
ELECT REM PT RETURN 9FT ADLT (ELECTROSURGICAL) ×2
ELECTRODE REM PT RTRN 9FT ADLT (ELECTROSURGICAL) ×1 IMPLANT
GAUZE 4X4 16PLY RFD (DISPOSABLE) ×2 IMPLANT
GLOVE BIO SURGEON STRL SZ8 (GLOVE) ×2 IMPLANT
GLOVE BIOGEL PI IND STRL 7.5 (GLOVE) ×2 IMPLANT
GLOVE BIOGEL PI IND STRL 8 (GLOVE) ×2 IMPLANT
GLOVE BIOGEL PI IND STRL 8.5 (GLOVE) ×1 IMPLANT
GLOVE BIOGEL PI INDICATOR 7.5 (GLOVE) ×2
GLOVE BIOGEL PI INDICATOR 8 (GLOVE) ×2
GLOVE BIOGEL PI INDICATOR 8.5 (GLOVE) ×1
GLOVE ECLIPSE 6.5 STRL STRAW (GLOVE) ×2 IMPLANT
GLOVE ECLIPSE 8.0 STRL XLNG CF (GLOVE) ×4 IMPLANT
GLOVE EXAM NITRILE XL STR (GLOVE) IMPLANT
GLOVE SURG SS PI 7.0 STRL IVOR (GLOVE) ×6 IMPLANT
GOWN STRL REUS W/ TWL LRG LVL3 (GOWN DISPOSABLE) ×2 IMPLANT
GOWN STRL REUS W/ TWL XL LVL3 (GOWN DISPOSABLE) ×1 IMPLANT
GOWN STRL REUS W/TWL 2XL LVL3 (GOWN DISPOSABLE) ×2 IMPLANT
GOWN STRL REUS W/TWL LRG LVL3 (GOWN DISPOSABLE) ×2
GOWN STRL REUS W/TWL XL LVL3 (GOWN DISPOSABLE) ×1
KIT BASIN OR (CUSTOM PROCEDURE TRAY) ×2 IMPLANT
KIT DILATOR XLIF 5 (KITS) ×2 IMPLANT
KIT INFUSE XX SMALL 0.7CC (Orthopedic Implant) ×2 IMPLANT
KIT SURGICAL ACCESS MAXCESS 4 (KITS) ×2 IMPLANT
KIT TURNOVER KIT B (KITS) ×2 IMPLANT
MODULE NVM5 NEXT GEN EMG (NEEDLE) ×2 IMPLANT
MODULUS XLW 10X22X55MM 10 (Spine Construct) ×2 IMPLANT
NEEDLE HYPO 25X1 1.5 SAFETY (NEEDLE) ×2 IMPLANT
NS IRRIG 1000ML POUR BTL (IV SOLUTION) ×2 IMPLANT
OIL CARTRIDGE MAESTRO DRILL (MISCELLANEOUS)
PACK LAMINECTOMY NEURO (CUSTOM PROCEDURE TRAY) ×2 IMPLANT
PLATE 2H 10MM (Plate) ×2 IMPLANT
PUTTY BONE ATTRAX 5CC STRIP (Putty) ×2 IMPLANT
SCREW DECADE 5.5X50 (Screw) ×4 IMPLANT
SPONGE LAP 4X18 RFD (DISPOSABLE) IMPLANT
SPONGE SURGIFOAM ABS GEL SZ50 (HEMOSTASIS) ×2 IMPLANT
STAPLER SKIN PROX WIDE 3.9 (STAPLE) ×2 IMPLANT
SUT VIC AB 1 CT1 18XBRD ANBCTR (SUTURE) ×1 IMPLANT
SUT VIC AB 1 CT1 8-18 (SUTURE) ×1
SUT VIC AB 2-0 CT1 18 (SUTURE) ×2 IMPLANT
SUT VIC AB 3-0 SH 8-18 (SUTURE) ×4 IMPLANT
TOWEL GREEN STERILE (TOWEL DISPOSABLE) ×2 IMPLANT
TOWEL GREEN STERILE FF (TOWEL DISPOSABLE) ×2 IMPLANT
TRAY FOLEY MTR SLVR 16FR STAT (SET/KITS/TRAYS/PACK) ×2 IMPLANT
WATER STERILE IRR 1000ML POUR (IV SOLUTION) ×2 IMPLANT

## 2018-10-19 NOTE — Brief Op Note (Signed)
10/19/2018  9:33 AM  PATIENT:  Dustin Keith  72 y.o. male  PRE-OPERATIVE DIAGNOSIS:  Spinal stenosis, Lumbar region without neurogenic claudication, herniated lumbar disc, sagittal plane imbalance, lumbago, radiculopathy  POST-OPERATIVE DIAGNOSIS:  Spinal stenosis, Lumbar region without neurogenic claudication, herniated lumbar disc, sagittal plane imbalance, lumbago, radiculopathy   PROCEDURE:  Procedure(s): Lumbar one-two Anterolateral lumbar interbody fusion with lateral plate (Right)  SURGEON:  Surgeon(s) and Role:    Erline Levine, MD - Primary    * Ashok Pall, MD - Assisting  PHYSICIAN ASSISTANT:   ASSISTANTS: Poteat, RN   ANESTHESIA:   general  EBL:  50 mL   BLOOD ADMINISTERED:none  DRAINS: none   LOCAL MEDICATIONS USED:  MARCAINE    and LIDOCAINE   SPECIMEN:  No Specimen  DISPOSITION OF SPECIMEN:  N/A  COUNTS:  YES  TOURNIQUET:  * No tourniquets in log *  DICTATION: Patient is a 72 year old with severe spondylosis stenosis and 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 It was elected to take him to surgery for anterolateral decompression and lateral plate fixation at the L 12 level.  He had previously undergone fusion L 2 - S 1 levels.  Procedure: Patient was brought to the operating room and placed in a left lateral decubitus position on the operative table and using orthogonally projected C-arm fluoroscopy the patient was placed so that the L 12  level was visualized in AP and lateral plane. The patient was then taped into position. The table was flexed so as to expose the L 12 level. Skin was marked along with a posterior finger dissection incision. His flank was then prepped and draped in usual sterile fashion and incisions were made overlying the 11 th rib. Posterior finger dissection was made to enter the retroperitoneal space and then subsequently the probe was inserted into the psoas muscle from the right side initially  at the L12 level. After mapping the neural elements were able to dock the probe per the midpoint of this vertebral level and without indications electrically of too close proximity to the neural tissues. Subsequently the self-retaining tractor was.after sequential dilators were utilized the shim was employed and the interspace was cleared of psoas muscle and then incised. A thorough discectomy was performed. Instruments were used to clear the interspace of disc material. After thorough discectomy was performed and this was performed using AP and lateral fluoroscopy a 10 lordotic by 55 x 22 mm implant was packed with extra extra small  BMP and Attrax. This was tamped into position and its position was confirmed on AP and lateral fluoroscopy. A Decade 2-hole  lateral plate (size 10) was affixed to the lateral spine with 5.5 x 50 mm screws. All screws were torque locked and positioning was confirmed with AP and lateral fluoroscopy. . Hemostasis was assured the wounds were irrigated interrupted Vicryl sutures.Sterile occlusive dressing was placed with Dermabond. The patient was then extubated in the operating room and taken to recovery in stable and satisfactory condition having tolerated his operation well. Counts were correct at the end of the case.  PLAN OF CARE: Admit to inpatient   PATIENT DISPOSITION:  PACU - hemodynamically stable.   Delay start of Pharmacological VTE agent (>24hrs) due to surgical blood loss or risk of bleeding: yes

## 2018-10-19 NOTE — Anesthesia Procedure Notes (Signed)
Procedure Name: Intubation Date/Time: 10/19/2018 7:35 AM Performed by: Moshe Salisbury, CRNA Pre-anesthesia Checklist: Patient identified, Emergency Drugs available, Suction available and Patient being monitored Patient Re-evaluated:Patient Re-evaluated prior to induction Oxygen Delivery Method: Circle System Utilized Preoxygenation: Pre-oxygenation with 100% oxygen Induction Type: IV induction Ventilation: Mask ventilation without difficulty Laryngoscope Size: Mac and 4 Grade View: Grade I Tube type: Oral Tube size: 8.0 mm Number of attempts: 1 Airway Equipment and Method: Stylet Placement Confirmation: ETT inserted through vocal cords under direct vision,  positive ETCO2 and breath sounds checked- equal and bilateral Secured at: 22 cm Tube secured with: Tape Dental Injury: Teeth and Oropharynx as per pre-operative assessment

## 2018-10-19 NOTE — Transfer of Care (Signed)
Immediate Anesthesia Transfer of Care Note  Patient: Dustin Keith  Procedure(s) Performed: Lumbar one-two Anterolateral lumbar interbody fusion with lateral plate (Right Flank)  Patient Location: PACU  Anesthesia Type:General  Level of Consciousness: drowsy and patient cooperative  Airway & Oxygen Therapy: Patient Spontanous Breathing and Patient connected to nasal cannula oxygen  Post-op Assessment: Report given to RN, Post -op Vital signs reviewed and stable and Patient moving all extremities  Post vital signs: Reviewed and stable  Last Vitals:  Vitals Value Taken Time  BP 155/81 10/19/2018  9:50 AM  Temp    Pulse 87 10/19/2018  9:51 AM  Resp 12 10/19/2018  9:51 AM  SpO2 97 % 10/19/2018  9:51 AM  Vitals shown include unvalidated device data.  Last Pain:  Vitals:   10/19/18 0612  TempSrc:   PainSc: 0-No pain         Complications: No apparent anesthesia complications

## 2018-10-19 NOTE — Progress Notes (Signed)
Sleepy, but arousable in PACU.  MAEW with good strength to command.  Doing well.

## 2018-10-19 NOTE — Progress Notes (Signed)
PT Cancellation Note  Patient Details Name: Dustin Keith MRN: [id number] DOB: May 23, 1946   Cancelled Treatment:    Reason Eval/Treat Not Completed: Other (comment); RN reports just walked and back to bed.  Will attempt another day.   Reginia Naas 10/19/2018, 3:47 PM Magda Kiel, [place] [phone] 10/19/2018

## 2018-10-19 NOTE — Op Note (Signed)
10/19/2018  9:33 AM  PATIENT:  Hetty Blend  72 y.o. male  PRE-OPERATIVE DIAGNOSIS:  Spinal stenosis, Lumbar region without neurogenic claudication, herniated lumbar disc, sagittal plane imbalance, lumbago, radiculopathy  POST-OPERATIVE DIAGNOSIS:  Spinal stenosis, Lumbar region without neurogenic claudication, herniated lumbar disc, sagittal plane imbalance, lumbago, radiculopathy   PROCEDURE:  Procedure(s): Lumbar one-two Anterolateral lumbar interbody fusion with lateral plate (Right)  SURGEON:  Surgeon(s) and Role:    Erline Levine, MD - Primary    * Ashok Pall, MD - Assisting  PHYSICIAN ASSISTANT:   ASSISTANTS: Poteat, RN   ANESTHESIA:   general  EBL:  50 mL   BLOOD ADMINISTERED:none  DRAINS: none   LOCAL MEDICATIONS USED:  MARCAINE    and LIDOCAINE   SPECIMEN:  No Specimen  DISPOSITION OF SPECIMEN:  N/A  COUNTS:  YES  TOURNIQUET:  * No tourniquets in log *  DICTATION: Patient is a 72 year old with severe spondylosis stenosis and 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 It was elected to take him to surgery for anterolateral decompression and lateral plate fixation at the L 12 level.  He had previously undergone fusion L 2 - S 1 levels.  Procedure: Patient was brought to the operating room and placed in a left lateral decubitus position on the operative table and using orthogonally projected C-arm fluoroscopy the patient was placed so that the L 12  level was visualized in AP and lateral plane. The patient was then taped into position. The table was flexed so as to expose the L 12 level. Skin was marked along with a posterior finger dissection incision. His flank was then prepped and draped in usual sterile fashion and incisions were made overlying the 11 th rib. Posterior finger dissection was made to enter the retroperitoneal space and then subsequently the probe was inserted into the psoas muscle from the right side initially  at the L12 level. After mapping the neural elements were able to dock the probe per the midpoint of this vertebral level and without indications electrically of too close proximity to the neural tissues. Subsequently the self-retaining tractor was.after sequential dilators were utilized the shim was employed and the interspace was cleared of psoas muscle and then incised. A thorough discectomy was performed. Instruments were used to clear the interspace of disc material. After thorough discectomy was performed and this was performed using AP and lateral fluoroscopy a 10 lordotic by 55 x 22 mm implant was packed with extra extra small  BMP and Attrax. This was tamped into position and its position was confirmed on AP and lateral fluoroscopy. A Decade 2-hole  lateral plate (size 10) was affixed to the lateral spine with 5.5 x 50 mm screws. All screws were torque locked and positioning was confirmed with AP and lateral fluoroscopy. . Hemostasis was assured the wounds were irrigated interrupted Vicryl sutures.Sterile occlusive dressing was placed with Dermabond. The patient was then extubated in the operating room and taken to recovery in stable and satisfactory condition having tolerated his operation well. Counts were correct at the end of the case.  PLAN OF CARE: Admit to inpatient   PATIENT DISPOSITION:  PACU - hemodynamically stable.   Delay start of Pharmacological VTE agent (>24hrs) due to surgical blood loss or risk of bleeding: yes

## 2018-10-19 NOTE — Anesthesia Postprocedure Evaluation (Signed)
Anesthesia Post Note  Patient: Dustin Keith  Procedure(s) Performed: Lumbar one-two Anterolateral lumbar interbody fusion with lateral plate (Right Flank)     Patient location during evaluation: PACU Anesthesia Type: General Level of consciousness: sedated Pain management: pain level controlled Vital Signs Assessment: post-procedure vital signs reviewed and stable Respiratory status: spontaneous breathing and respiratory function stable Cardiovascular status: stable Postop Assessment: no apparent nausea or vomiting Anesthetic complications: no    Last Vitals:  Vitals:   10/19/18 1150 10/19/18 1205  BP: 132/63 138/76  Pulse: 87 84  Resp: 11 12  Temp:    SpO2: 95% 93%                  Allyn Bartelson DANIEL

## 2018-10-19 NOTE — Interval H&P Note (Signed)
History and Physical Interval Note:  10/19/2018 7:32 AM  Dustin Keith  has presented today for surgery, with the diagnosis of Spinal stenosis, Lumbar region with out neurogenic claudication  The various methods of treatment have been discussed with the patient and family. After consideration of risks, benefits and other options for treatment, the patient has consented to  Procedure(s) with comments: Lumbar 1-2 Anterolateral lumbar interbody fusion with lateral plate (N/A) - Lumbar 1-2 Anterolateral lumbar interbody fusion with lateral plate as a surgical intervention .  The patient's history has been reviewed, patient examined, no change in status, stable for surgery.  I have reviewed the patient's chart and labs.  Questions were answered to the patient's satisfaction.     Peggyann Shoals

## 2018-10-20 ENCOUNTER — Encounter (HOSPITAL_COMMUNITY): Payer: Self-pay | Admitting: Neurosurgery

## 2018-10-20 MED ORDER — METHOCARBAMOL 500 MG PO TABS: 500.0000 mg | ORAL_TABLET | Freq: Four times a day (QID) | ORAL | 1 refills | Status: AC | PRN

## 2018-10-20 MED ORDER — OXYCODONE-ACETAMINOPHEN 5-325 MG PO TABS: 1.0000 | ORAL_TABLET | ORAL | 0 refills | Status: AC | PRN

## 2018-10-20 MED FILL — Sodium Chloride IV Soln 0.9%: INTRAVENOUS | Qty: 1000 | Status: AC

## 2018-10-20 MED FILL — Heparin Sodium (Porcine) Inj 1000 Unit/ML: INTRAMUSCULAR | Qty: 30 | Status: AC

## 2018-10-20 NOTE — Discharge Summary (Signed)
Physician Discharge Summary  Patient ID: Dustin Keith MRN: [id number] DOB/AGE: June 05, 1946 72 y.o.  Admit date: 10/19/2018 Discharge date: 10/20/2018  Admission Diagnoses:Lumbar spinal stenosis with kyphosis, lumbago, radiculopathy  Discharge Diagnoses: Same Active Problems:   Degenerative lumbar spinal stenosis   Discharged Condition: good  Hospital Course: Patient underwent anterolateral decompression and fusion with interbody cage and lateral plate at L 12 level.  He did well with surgery and was pain free on POD 1.     Consults: None  Significant Diagnostic Studies: None  Treatments: surgery:Patient underwent anterolateral decompression and fusion with interbody cage and lateral plate at L 12 level  Discharge Exam: Blood pressure 122/68, pulse 84, temperature 98 F (36.7 C), temperature source Oral, resp. rate 16, height 5\' 10"  (1.778 m), weight 117.9 kg, SpO2 95 %. Neurologic: Alert and oriented X 3, normal strength and tone. Normal symmetric reflexes. Normal coordination and gait Wound:CDI  Disposition: Home  Discharge Instructions    Diet - low sodium heart healthy   Complete by:  As directed    Increase activity slowly   Complete by:  As directed      Allergies as of 10/20/2018      Reactions   Atenolol Nausea Only, Other (See Comments)   Muscle and joint pain Myalgias   Gabapentin Other (See Comments)   Temporary Amnesia  Insomnia      Medication List    TAKE these medications   amLODipine 5 MG tablet Commonly known as:  NORVASC Take 5 mg by mouth daily.   ezetimibe 10 MG tablet Commonly known as:  ZETIA Take 10 mg by mouth daily.   levothyroxine 100 MCG tablet Commonly known as:  SYNTHROID, LEVOTHROID Take 100 mcg by mouth daily before breakfast.   losartan 100 MG tablet Commonly known as:  COZAAR Take 100 mg by mouth daily.   methocarbamol 500 MG tablet Commonly known as:  ROBAXIN Take 1 tablet (500 mg total) by mouth every 6 (six) hours  as needed for muscle spasms. What changed:  Another medication with the same name was added. Make sure you understand how and when to take each.   methocarbamol 500 MG tablet Commonly known as:  ROBAXIN Take 1 tablet (500 mg total) by mouth every 6 (six) hours as needed for muscle spasms. What changed:  You were already taking a medication with the same name, and this prescription was added. Make sure you understand how and when to take each.   MULTIVITAMIN PO Take 1 tablet by mouth daily.   niacin 100 MG tablet Take 100 mg by mouth at bedtime.   oxyCODONE 5 MG immediate release tablet Commonly known as:  Oxy IR/ROXICODONE Take 1-2 tablets (5-10 mg total) by mouth every 4 (four) hours as needed for moderate pain or severe pain.   oxyCODONE-acetaminophen 5-325 MG tablet Commonly known as:  PERCOCET/ROXICET Take 1-2 tablets by mouth every 4 (four) hours as needed for severe pain.   rivaroxaban 10 MG Tabs tablet Commonly known as:  XARELTO Take 1 tablet (10 mg total) by mouth daily with breakfast. Take Xarelto for two and a half more weeks following discharge from the hospital, then discontinue Xarelto. Once the patient has completed the Xarelto, they may resume the 81 mg Aspirin.   SYSTANE 0.4-0.3 % Soln Generic drug:  Polyethyl Glycol-Propyl Glycol Place 1 drop into both eyes daily as needed (for dry eyes).   testosterone cypionate 200 MG/ML injection Commonly known as:  DEPOTESTOSTERONE CYPIONATE Inject 200 mg into  the muscle every 14 (fourteen) days.   traMADol 50 MG tablet Commonly known as:  ULTRAM Take 1-2 tablets (50-100 mg total) by mouth every 6 (six) hours as needed (mild pain).   VITAMIN C PO Take 1 tablet by mouth 2 (two) times daily.        Signed: Peggyann Shoals, MD 10/20/2018, 8:29 AM

## 2018-10-20 NOTE — Evaluation (Signed)
Occupational Therapy Evaluation and Discharge Patient Details Name: Dustin Keith MRN: [id number] DOB: [birth year] Today's Date: 10/20/2018    History of Present Illness Pt is a 72 y.o. M who underwent anterolateral decompression and fusion with interbody cage and lateral plate at P80 level. 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   Clinical Impression   This 72 yo male admitted and underwent above presents to acute OT with all education completed we will D/C from acute OT.    Follow Up Recommendations  No OT follow up;Supervision - Intermittent    Equipment Recommendations  None recommended by OT       Precautions / Restrictions Precautions Precautions: Back Precaution Booklet Issued: Yes (comment) Precaution Comments: Pt recalling 2/3. Provided written handout Required Braces or Orthoses: Spinal Brace Spinal Brace: Thoracolumbosacral orthotic Spinal Brace Comments: Pt left brace at home Restrictions Weight Bearing Restrictions: No      Mobility Bed Mobility               General bed mobility comments: sitting on EOB  Transfers Overall transfer level: Modified independent Equipment used: None             General transfer comment: increased time    Balance Overall balance assessment: No apparent balance deficits (not formally assessed)                                         ADL either performed or assessed with clinical judgement   ADL                                         General ADL Comments: Pt able to get LB dressed by crossing one leg over the other. Educated on using wet wipes for back peri care, use of 2 cups for brushing teeth to avoid bending over sink, only sitting 20-30 minutes at a time then getting up-walk around-then can sit for another 20-30 minutes, having items at counter top level that are often needed, sit<>stand stance that is easier to keep  back straight     Vision Patient Visual Report: No change from baseline              Pertinent Vitals/Pain Pain Assessment: 0-10 Pain Score: 4  Faces Pain Scale: Hurts little more Pain Location: incisional Pain Descriptors / Indicators: Sore Pain Intervention(s): Monitored during session     Hand Dominance Right   Extremity/Trunk Assessment Upper Extremity Assessment Upper Extremity Assessment: Overall WFL for tasks assessed     Communication Communication Communication: No difficulties   Cognition Arousal/Alertness: Awake/alert Behavior During Therapy: WFL for tasks assessed/performed Overall Cognitive Status: Within Functional Limits for tasks assessed                                                Home Living Family/patient expects to be discharged to:: Private residence Living Arrangements: Spouse/significant other Available Help at Discharge: Family Type of Home: House Home Access: Stairs to enter Technical brewer of Steps: 3   Home Layout: Two level Alternate Level Stairs-Number of Steps: (flight) Alternate Level Stairs-Rails: Right;Left Bathroom Shower/Tub: Walk-in shower;Door  Bathroom Toilet: Programmer, systems: Yes   Home Equipment: Cane - single point;Shower seat;Grab bars - tub/shower;Hand held shower head   Additional Comments: Neighbor's home set up information. 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        OT Problem List: Decreased strength;Decreased range of motion;Impaired balance (sitting and/or standing);Pain         OT Goals(Current goals can be found in the care plan section) Acute Rehab OT Goals Patient Stated Goal: home today  OT Frequency:                AM-PAC PT "6 Clicks" Daily Activity     Outcome  Measure Help from another person eating meals?: None Help from another person taking care of personal grooming?: None Help from another person toileting, which includes using toliet, bedpan, or urinal?: None Help from another person bathing (including washing, rinsing, drying)?: None Help from another person to put on and taking off regular upper body clothing?: None Help from another person to put on and taking off regular lower body clothing?: None 6 Click Score: 24   End of Session Nurse Communication: (pt ready to go from OT standpoint)  Activity Tolerance: Patient tolerated treatment well Patient left: (sitting EOB)  OT Visit Diagnosis: Unsteadiness on feet (R26.81);Pain Pain - part of body: (incisional)                Time: 1005-1017 OT Time Calculation (min): 12 min Charges:  OT General Charges $OT Visit: 1 Visit OT Evaluation $OT Eval Moderate Complexity: [place], OTR/L Acute [employer] Pager [phone] Office [phone]     Almon Register 10/20/2018, 11:42 AM

## 2018-10-20 NOTE — Evaluation (Signed)
Physical Therapy Evaluation Patient Details Name: Dustin Keith MRN: [id number] DOB: Jun 30, 1946 Today's Date: 10/20/2018   History of Present Illness  Pt is a 72 y.o. M who underwent anterolateral decompression and fusion with interbody cage and lateral plate at D92 level. 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  Clinical Impression  Patient evaluated by Physical Therapy with no further acute PT needs identified. On PT evaluation, patient verbalizes good pain control and no numbness/tingling in BLE's. Patient ambulating hallway distances with no assistive device without difficulty. Able to negotiate 10 steps with railing to prepare for discharge home. All education has been completed and the patient has no further questions. No follow-up Physical Therapy or equipment needs. PT is signing off. Thank you for this referral.     Follow Up Recommendations No PT follow up    Equipment Recommendations  None recommended by PT    Recommendations for Other Services       Precautions / Restrictions Precautions Precautions: Back Precaution Booklet Issued: Yes (comment) Precaution Comments: Pt recalling 2/3. Provided written handout Required Braces or Orthoses: Spinal Brace Spinal Brace: Thoracolumbosacral orthotic;Other (comment) Spinal Brace Comments: Pt left brace at home Restrictions Weight Bearing Restrictions: No      Mobility  Bed Mobility               General bed mobility comments: OOB in chair  Transfers Overall transfer level: Modified independent Equipment used: None                Ambulation/Gait Ambulation/Gait assistance: Modified independent (Device/Increase time) Gait Distance (Feet): 400 Feet Assistive device: None Gait Pattern/deviations: Wide base of support;Step-through pattern;Decreased stride length     General Gait Details: Bilateral foot external rotation (likely baseline) and wide BOS.  Decreased reciprocal arm swing. Guarded posture  Stairs Stairs: Yes Stairs assistance: Supervision Stair Management: One rail Right Number of Stairs: 10 General stair comments: step over step pattern  Wheelchair Mobility    Modified Rankin (Stroke Patients Only)       Balance Overall balance assessment: No apparent balance deficits (not formally assessed)                                           Pertinent Vitals/Pain Pain Assessment: Faces Faces Pain Scale: Hurts little more Pain Location: incisional Pain Descriptors / Indicators: Sore Pain Intervention(s): Monitored during session    Home Living Family/patient expects to be discharged to:: Private residence Living Arrangements: Spouse/significant other Available Help at Discharge: Family Type of Home: House Home Access: Stairs to enter   Technical brewer of Steps: 3 Home Layout: Two level Home Equipment: [place] - single point Additional Comments: Neighbor's home set up information. 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     Hand Dominance        Extremity/Trunk Assessment   Upper Extremity Assessment Upper Extremity Assessment: Defer to OT evaluation    Lower Extremity Assessment Lower Extremity Assessment: RLE deficits/detail;LLE deficits/detail RLE Deficits / Details: Strength 5/5 LLE Deficits / Details: Strength 5/5    Cervical / Trunk Assessment Cervical / Trunk Assessment: Other exceptions Cervical / Trunk Exceptions: s/p anterolateral decompression and fusion  Communication   Communication: No difficulties  Cognition Arousal/Alertness: Awake/alert Behavior During Therapy: WFL for tasks assessed/performed Overall Cognitive Status: Within Functional Limits for tasks assessed                                         General Comments      Exercises     Assessment/Plan    PT Assessment Patent does not need any further PT services  PT Problem List         PT Treatment Interventions      PT Goals (Current goals can be found in the Care Plan section)  Acute Rehab PT Goals Patient Stated Goal: "no numbness in either leg." PT Goal Formulation: All assessment and education complete, DC therapy    Frequency     Barriers to discharge        Co-evaluation               AM-PAC PT "6 Clicks" Daily Activity  Outcome Measure Difficulty turning over in bed (including adjusting bedclothes, sheets and blankets)?: None Difficulty moving from lying on back to sitting on the side of the bed? : A Little Difficulty sitting down on and standing up from a chair with arms (e.g., wheelchair, bedside commode, etc,.)?: None Help needed moving to and from a bed to chair (including a wheelchair)?: None Help needed walking in hospital room?: None Help needed climbing 3-5 steps with a railing? : A Little 6 Click Score: 22    End of Session Equipment Utilized During Treatment: Gait belt Activity Tolerance: Patient tolerated treatment well Patient left: in chair;with call bell/phone within reach Nurse Communication: Mobility status PT Visit Diagnosis: Difficulty in walking, not elsewhere classified (R26.2);Pain Pain - part of body: (back)    Time: 0962-8366 PT Time Calculation (min) (ACUTE ONLY): 16 min   Charges:   PT Evaluation $PT Eval Moderate Complexity: 1 Mod         Ellamae Sia, [place], DPT Acute Rehabilitation Services Pager [phone] Office [phone]   Willy Eddy 10/20/2018, 10:47 AM

## 2018-10-20 NOTE — Progress Notes (Signed)
Subjective: Patient reports "no pain"  Objective: Vital signs in last 24 hours: Temp:  [97.5 F (36.4 C)-98.5 F (36.9 C)] 98 F (36.7 C) (11/01 0742) Pulse Rate:  [36-108] 84 (11/01 0742) Resp:  [9-20] 16 (11/01 0742) BP: (105-155)/(52-94) 122/68 (11/01 0742) SpO2:  [90 %-99 %] 95 % (11/01 0742)  Intake/Output from previous day: 10/31 0701 - 11/01 0700 In: 2183.5 [P.O.:240; I.V.:1643.5; IV Piggyback:100] Out: 351 [Urine:301; Blood:50] Intake/Output this shift: No intake/output data recorded.  Physical Exam: Strength full. Dressings CDI.  Lab Results: No results for input(s): WBC, HGB, HCT, PLT in the last 72 hours. BMET No results for input(s): NA, K, CL, CO2, GLUCOSE, BUN, CREATININE, CALCIUM in the last 72 hours.  Studies/Results: Dg Thoracolumabar Spine  Result Date: 10/19/2018 CLINICAL DATA:  Surgical fusion of L1-2. EXAM: DG C-ARM 61-120 MIN; THORACOLUMBAR SPINE - 2 VIEW FLUOROSCOPY TIME:  1 minutes 59 seconds. COMPARISON:  MRI of May 28, 2018. FINDINGS: Two intraoperative fluoroscopic images of the thoracolumbar junction were obtained. These images demonstrate the interval interbody fusion of L1-2. Previous posterior fusion extending from L2 downward is again noted. Good alignment of vertebral bodies is noted. IMPRESSION: Status post surgical fusion of L1-2. Electronically Signed   By: Marijo Conception, M.D.   On: 10/19/2018 09:38   Dg C-arm 1-60 Min  Result Date: 10/19/2018 CLINICAL DATA:  Surgical fusion of L1-2. EXAM: DG C-ARM 61-120 MIN; THORACOLUMBAR SPINE - 2 VIEW FLUOROSCOPY TIME:  1 minutes 59 seconds. COMPARISON:  MRI of May 28, 2018. FINDINGS: Two intraoperative fluoroscopic images of the thoracolumbar junction were obtained. These images demonstrate the interval interbody fusion of L1-2. Previous posterior fusion extending from L2 downward is again noted. Good alignment of vertebral bodies is noted. IMPRESSION: Status post surgical fusion of L1-2. Electronically  Signed   By: Marijo Conception, M.D.   On: 10/19/2018 09:38   Dg C-arm 1-60 Min  Result Date: 10/19/2018 CLINICAL DATA:  Surgical fusion of L1-2. EXAM: DG C-ARM 61-120 MIN; THORACOLUMBAR SPINE - 2 VIEW FLUOROSCOPY TIME:  1 minutes 59 seconds. COMPARISON:  MRI of May 28, 2018. FINDINGS: Two intraoperative fluoroscopic images of the thoracolumbar junction were obtained. These images demonstrate the interval interbody fusion of L1-2. Previous posterior fusion extending from L2 downward is again noted. Good alignment of vertebral bodies is noted. IMPRESSION: Status post surgical fusion of L1-2. Electronically Signed   By: Marijo Conception, M.D.   On: 10/19/2018 09:38    Assessment/Plan: Patient is doing well.  Discharge home.    LOS: 1 day    Peggyann Shoals, MD 10/20/2018, 8:28 AM

## 2018-10-20 NOTE — Progress Notes (Signed)
Patient is discharged from room 3C11 at this time. Alert and in stable condition. IV site d/c'd and instructions read to patient and spouse with understanding verbalized. Left unit via wheelchair with all belongings at side. 

## 2018-10-20 NOTE — Discharge Instructions (Signed)
Wound Care °Leave incision open to air. °You may shower. °Do not scrub directly on incision.  °Do not put any creams, lotions, or ointments on incision. °Activity °Walk each and every day, increasing distance each day. °No lifting greater than 5 lbs.  Avoid bending, arching, and twisting. °No driving for 2 weeks; may ride as a passenger locally. °If provided with back brace, wear when out of bed.  It is not necessary to wear in bed. °Diet °Resume your normal diet.  °Return to Work °Will be discussed at you follow up appointment. °Call Your Doctor If Any of These Occur °Redness, drainage, or swelling at the wound.  °Temperature greater than 101 degrees. °Severe pain not relieved by pain medication. °Incision starts to come apart. °Follow Up Appt °Call today for appointment in 1-2 weeks ([phone]) or for problems.  If you have any hardware placed in your spine, you will need an x-ray before your appointment. °

## 2019-02-20 IMAGING — DX DG PORTABLE PELVIS
1 series · 1 of 1 positions shown · non-contrast
Comparison: 12/28/2017

CLINICAL DATA: Left hip replacement

EXAM:
PORTABLE PELVIS 1-2 VIEWS

[pelvis ap]
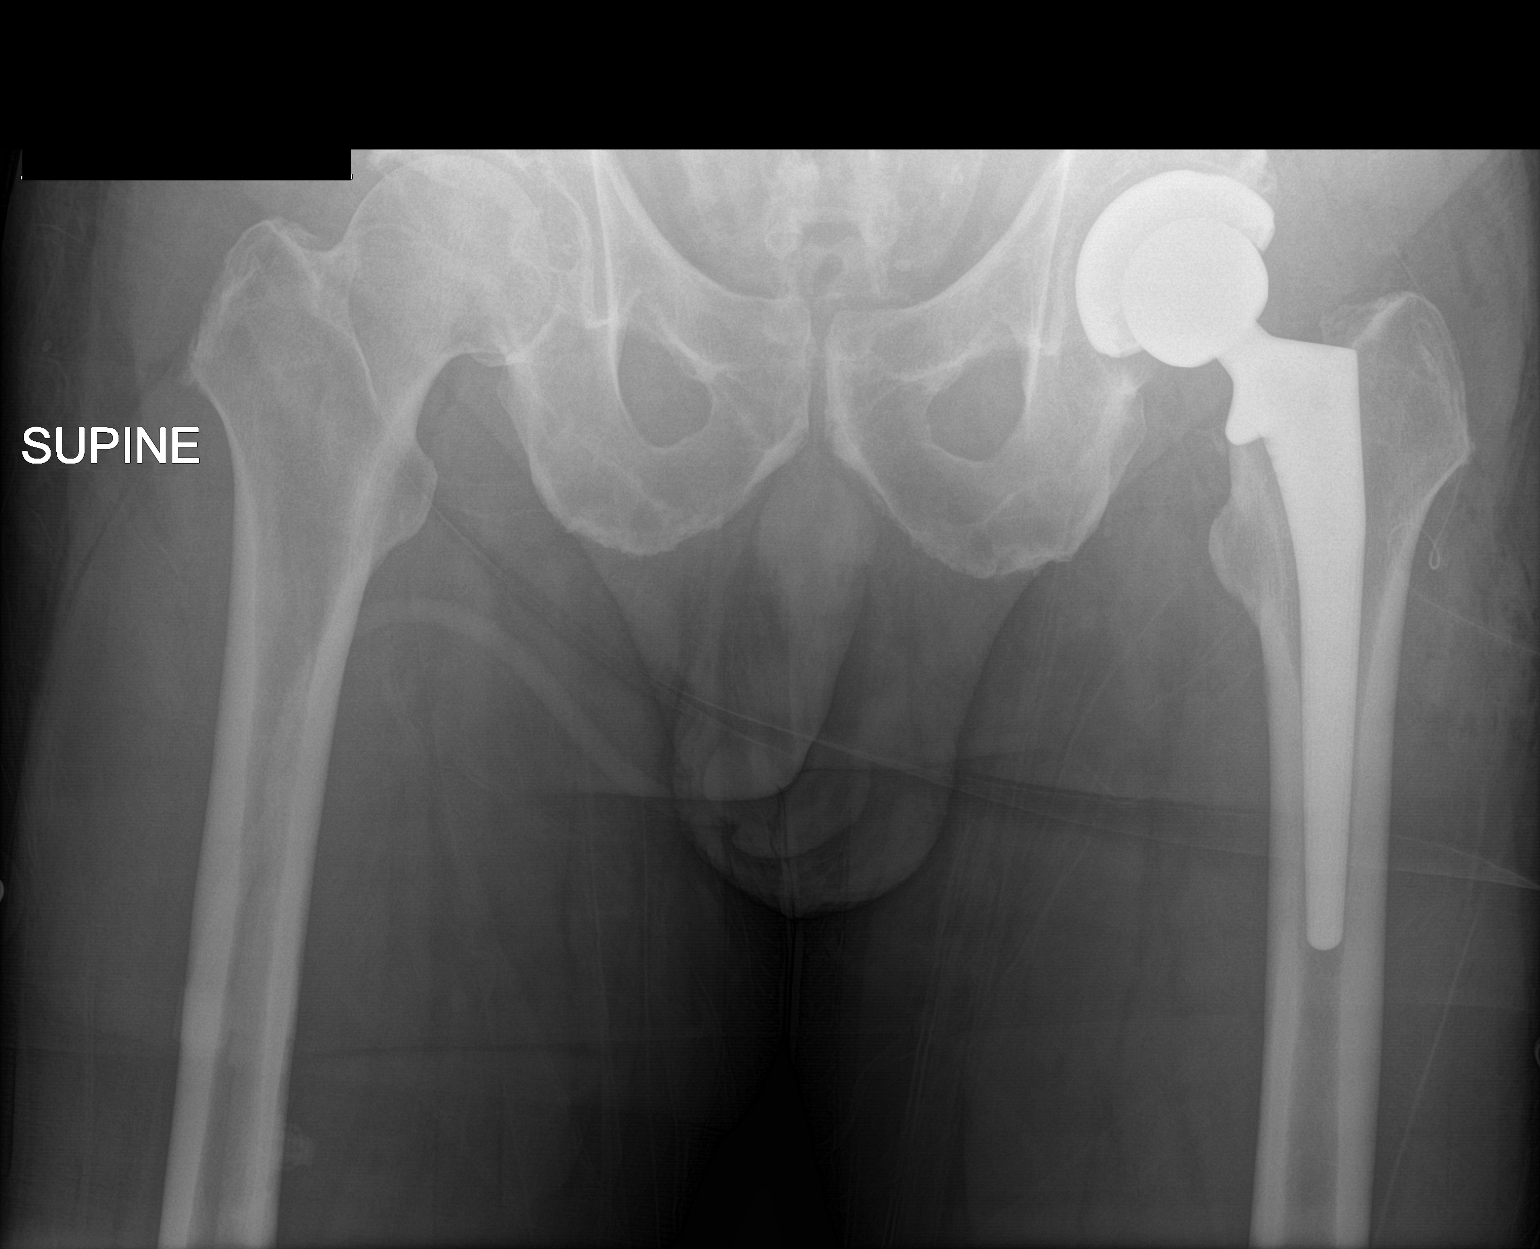

[1 of 1 positions shown; findings below may reference images not displayed]

FINDINGS: Left hip replacement in satisfactory position and alignment. No
fracture or complication
IMPRESSION: Satisfactory left hip replacement

## 2019-12-12 IMAGING — RF DG C-ARM 61-120 MIN
1 series · 2 of 2 positions shown · non-contrast
Comparison: MRI May 28, 2018.

CLINICAL DATA: Surgical fusion of L1-2.

EXAM:
DG C-ARM 61-120 MIN; THORACOLUMBAR SPINE - 2 VIEW
FLUOROSCOPY TIME:  1 minutes 59 seconds.

[Series 1: run · 2 of 2 slices shown]
[im 1/2]
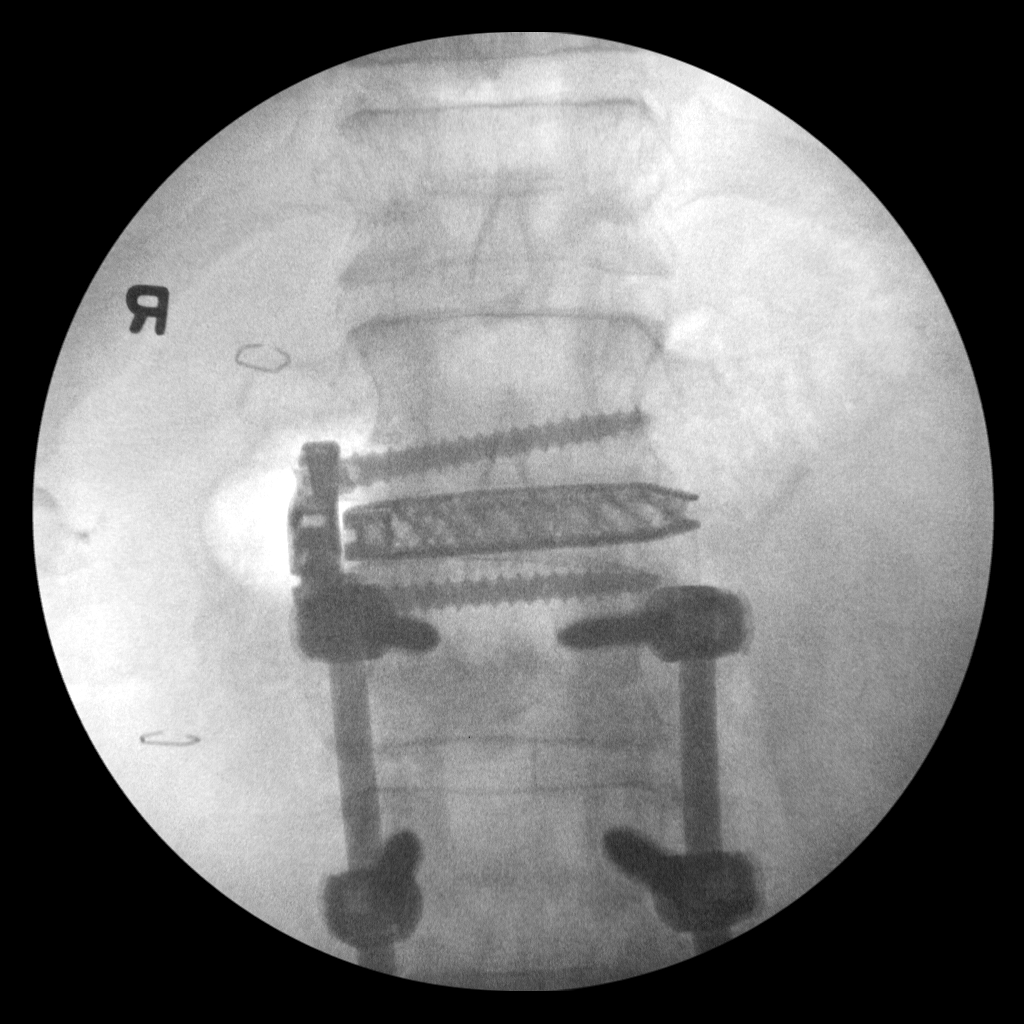
[im 2/2]
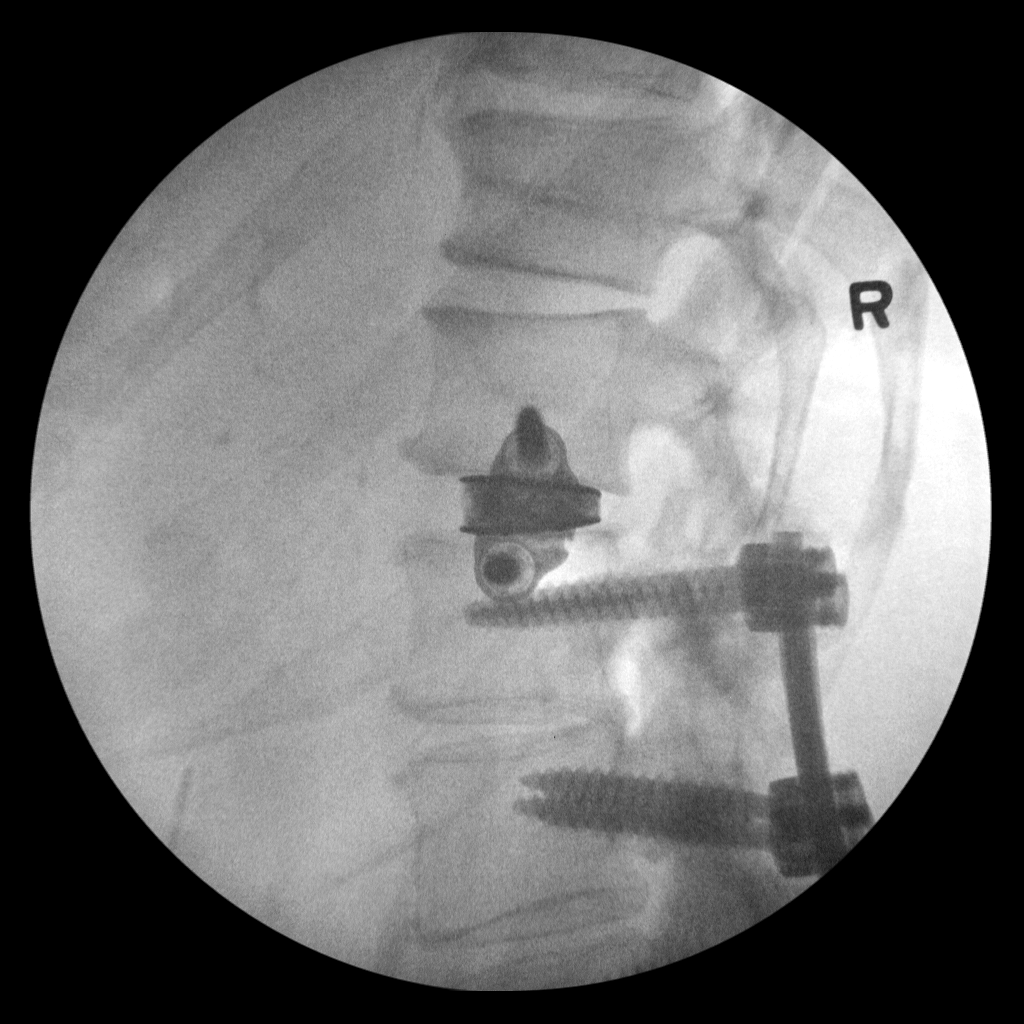

[2 of 2 positions shown; findings below may reference images not displayed]

FINDINGS: Two intraoperative fluoroscopic images of the thoracolumbar junction
were obtained. These images demonstrate the interval interbody
fusion of L1-2. Previous posterior fusion extending from L2 downward
is again noted. Good alignment of vertebral bodies is noted.
IMPRESSION: Status post surgical fusion of L1-2.
